# Patient Record
Sex: Female | Born: 1985 | Race: White | Hispanic: No | Marital: Married | Smoking: Former smoker
Health system: Southern US, Community
[De-identification: ages and names within clinical notes are randomized; demographics above are authoritative.]

## PROBLEM LIST (undated history)

## (undated) ENCOUNTER — Inpatient Hospital Stay: Payer: Self-pay

## (undated) DIAGNOSIS — F329 Major depressive disorder, single episode, unspecified: Secondary | ICD-10-CM

## (undated) DIAGNOSIS — F32A Depression, unspecified: Secondary | ICD-10-CM

## (undated) HISTORY — PX: TONSILLECTOMY: SUR1361

---

## 2012-03-27 LAB — HM PAP SMEAR: HM Pap smear: NEGATIVE

## 2015-12-29 ENCOUNTER — Emergency Department
Admission: EM | Admit: 2015-12-29 | Discharge: 2015-12-29 | Disposition: A | Discharge status: Home / Self Care | Payer: Medicaid Other | Attending: Emergency Medicine | Admitting: Emergency Medicine

## 2015-12-29 ENCOUNTER — Encounter: Payer: Self-pay | Admitting: Emergency Medicine

## 2015-12-29 DIAGNOSIS — J069 Acute upper respiratory infection, unspecified: Secondary | ICD-10-CM | POA: Insufficient documentation

## 2015-12-29 DIAGNOSIS — R05 Cough: Secondary | ICD-10-CM | POA: Diagnosis present

## 2015-12-29 DIAGNOSIS — F329 Major depressive disorder, single episode, unspecified: Secondary | ICD-10-CM | POA: Insufficient documentation

## 2015-12-29 DIAGNOSIS — F17200 Nicotine dependence, unspecified, uncomplicated: Secondary | ICD-10-CM | POA: Insufficient documentation

## 2015-12-29 HISTORY — DX: Depression, unspecified: F32.A

## 2015-12-29 HISTORY — DX: Major depressive disorder, single episode, unspecified: F32.9

## 2015-12-29 MED ORDER — IBUPROFEN 800 MG PO TABS: 800.0000 mg | ORAL_TABLET | Freq: Three times a day (TID) | ORAL | Status: DC | PRN

## 2015-12-29 MED ORDER — HYDROCOD POLST-CPM POLST ER 10-8 MG/5ML PO SUER
5.0000 mL | Freq: Once | ORAL | Status: AC
  Administered 2015-12-29: 5 mL via ORAL
  Filled 2015-12-29: qty 5

## 2015-12-29 MED ORDER — HYDROCOD POLST-CPM POLST ER 10-8 MG/5ML PO SUER: 5.0000 mL | Freq: Two times a day (BID) | ORAL | Status: DC

## 2015-12-29 MED ORDER — PSEUDOEPHEDRINE HCL ER 120 MG PO TB12: 120.0000 mg | ORAL_TABLET | Freq: Two times a day (BID) | ORAL | Status: DC | PRN

## 2015-12-29 NOTE — ED Notes (Signed)
Pt to ed with c/o chest pain and cough that started on Sunday. Pt states chest pain only occurs with cough.  Denies fever.  Reports runny nose, and congestion, sore throat and earache.

## 2015-12-29 NOTE — Discharge Instructions (Signed)

## 2015-12-29 NOTE — ED Notes (Signed)
See triage note   States cough and congestion since this weekend  PA in room on arrival

## 2015-12-29 NOTE — ED Notes (Signed)
Discussed discharge instructions, prescriptions, and follow-up care with patient. No questions or concerns at this time. Pt stable at discharge.  

## 2015-12-29 NOTE — ED Provider Notes (Signed)
[facility] Emergency Department Provider Note   ____________________________________________  Time seen: Approximately 3:21 PM  I have reviewed the triage vital signs and the nursing notes.   HISTORY  Chief Complaint Cough and Chest Pain    HPI Natasha Riddle is a 30 y.o. female patient complaining of cough congestion and chest wall pain for 3 days. Patient state pain and chest care is increased coughing episode. Patient denies any fever or chills associated this complaint. Patient does complain of rhinorrhea sore throat and ear pressure pain. Patient rated her pain discomfort as a 5/10. No palliative measures taken for this complaint.   Past Medical History  Diagnosis Date  . Depression     There are no active problems to display for this patient.   History reviewed. No pertinent past surgical history.  Current Outpatient Rx  Name  Route  Sig  Dispense  Refill  . chlorpheniramine-HYDROcodone (TUSSIONEX PENNKINETIC ER) 10-8 MG/5ML SUER   Oral   Take 5 mLs by mouth 2 (two) times daily.   115 mL   0   . ibuprofen (ADVIL,MOTRIN) 800 MG tablet   Oral   Take 1 tablet (800 mg total) by mouth every 8 (eight) hours as needed.   30 tablet   0   . pseudoephedrine (SUDAFED) 120 MG 12 hr tablet   Oral   Take 1 tablet (120 mg total) by mouth 2 (two) times daily as needed for congestion.   20 tablet   2     Allergies Review of patient's allergies indicates no known allergies.  History reviewed. No pertinent family history.  Social History Social History  Substance Use Topics  . Smoking status: Current Every Day Smoker  . Smokeless tobacco: None  . Alcohol Use: No    Review of Systems Constitutional: No fever/chills Eyes: No visual changes. ZOX:WRUEENT:Sore throat, runny nose, and ear pressure. Cardiovascular: Denies chest pain. Respiratory: Denies shortness of breath.Nonproductive cough Gastrointestinal: No abdominal pain.  No nausea, no  vomiting.  No diarrhea.  No constipation. Genitourinary: Negative for dysuria. Musculoskeletal: Negative for back pain. Skin: Negative for rash. Neurological: Negative for headaches, focal weakness or numbness. Psychiatric:Depression   ____________________________________________   PHYSICAL EXAM:  VITAL SIGNS: ED Triage Vitals  Enc Vitals Group     BP 12/29/15 1509 131/85 mmHg     Pulse Rate 12/29/15 1509 98     Resp 12/29/15 1509 20     Temp 12/29/15 1509 98.5 F (36.9 C)     Temp Source 12/29/15 1509 Oral     SpO2 12/29/15 1509 100 %     Weight 12/29/15 1509 180 lb (81.647 kg)     Height 12/29/15 1509 5\' 3"  (1.6 m)     Head Cir --      Peak Flow --      Pain Score 12/29/15 1509 5     Pain Loc --      Pain Edu? --      Excl. in GC? --     Constitutional: Alert and oriented. Well appearing and in no acute distress. Eyes: Conjunctivae are normal. PERRL. EOMI. Head: Atraumatic. Nose: Edematous nasal turbinates clear rhinorrhea Mouth/Throat: Mucous membranes are moist.  Oropharynx non-erythematous. Neck: No stridor. No cervical spine tenderness to palpation. Hematological/Lymphatic/Immunilogical: No cervical lymphadenopathy. Cardiovascular: Normal rate, regular rhythm. Grossly normal heart sounds.  Good peripheral circulation. Respiratory: Normal respiratory effort.  No retractions. Lungs CTAB.Nonproductive cough Gastrointestinal: Soft and nontender. No distention. No abdominal bruits. No CVA  tenderness. Musculoskeletal: No lower extremity tenderness nor edema.  No joint effusions. Neurologic:  Normal speech and language. No gross focal neurologic deficits are appreciated. No gait instability. Skin:  Skin is warm, dry and intact. No rash noted. Psychiatric: Mood and affect are normal. Speech and behavior are normal.  ____________________________________________   LABS (all labs ordered are listed, but only abnormal results are displayed)  Labs Reviewed - No data to  display ____________________________________________  EKG Read by heart station Dr.,  normal sinus rhythm  ____________________________________________  RADIOLOGY   ____________________________________________   PROCEDURES  Procedure(s) performed: None  Critical Care performed: No  ____________________________________________   INITIAL IMPRESSION / ASSESSMENT AND PLAN / ED COURSE  Pertinent labs & imaging results that were available during my care of the patient were reviewed by me and considered in my medical decision making (see chart for details).  Upper respiratory infection and chest wall pain secondary to continue coughing. Patient given discharge care instructions. Patient given prescription for Sudafed and Tussionex. Ad[facility]. ____________________________________________   FINAL CLINICAL IMPRESSION(S) / ED DIAGNOSES  Final diagnoses:  Acute URI      NEW MEDICATIONS STARTED DURING THIS VISIT:  New Prescriptions   CHLORPHENIRAMINE-HYDROCODONE (TUSSIONEX PENNKINETIC ER) 10-8 MG/5ML SUER    Take 5 mLs by mouth 2 (two) times daily.   IBUPROFEN (ADVIL,MOTRIN) 800 MG TABLET    Take 1 tablet (800 mg total) by mouth every 8 (eight) hours as needed.   PSEUDOEPHEDRINE (SUDAFED) 120 MG 12 HR TABLET    Take 1 tablet (120 mg total) by mouth 2 (two) times daily as needed for congestion.     Note:  This document was prepared using Dragon voice recognition software and may include unintentional dictation errors.    Joni Reining, PA-C 12/29/15 1537  Emily Filbert, MD 12/29/15 [phone]

## 2016-01-27 ENCOUNTER — Emergency Department
Admission: EM | Admit: 2016-01-27 | Discharge: 2016-01-27 | Disposition: A | Discharge status: Home / Self Care | Payer: Medicaid Other | Attending: Emergency Medicine | Admitting: Emergency Medicine

## 2016-01-27 ENCOUNTER — Encounter: Payer: Self-pay | Admitting: *Deleted

## 2016-01-27 DIAGNOSIS — L299 Pruritus, unspecified: Secondary | ICD-10-CM | POA: Diagnosis present

## 2016-01-27 DIAGNOSIS — F17200 Nicotine dependence, unspecified, uncomplicated: Secondary | ICD-10-CM | POA: Diagnosis not present

## 2016-01-27 DIAGNOSIS — F329 Major depressive disorder, single episode, unspecified: Secondary | ICD-10-CM | POA: Diagnosis not present

## 2016-01-27 DIAGNOSIS — L508 Other urticaria: Secondary | ICD-10-CM

## 2016-01-27 DIAGNOSIS — L509 Urticaria, unspecified: Secondary | ICD-10-CM | POA: Insufficient documentation

## 2016-01-27 LAB — URINALYSIS COMPLETE WITH MICROSCOPIC (ARMC ONLY)
Bilirubin Urine: NEGATIVE
GLUCOSE, UA: NEGATIVE mg/dL
Ketones, ur: NEGATIVE mg/dL
Nitrite: POSITIVE — AB
PROTEIN: 30 mg/dL — AB
Specific Gravity, Urine: 1.031 — ABNORMAL HIGH (ref 1.005–1.030)
pH: 5 (ref 5.0–8.0)

## 2016-01-27 LAB — POCT PREGNANCY, URINE: PREG TEST UR: NEGATIVE

## 2016-01-27 MED ORDER — DEXAMETHASONE SODIUM PHOSPHATE 10 MG/ML IJ SOLN
10.0000 mg | Freq: Once | INTRAMUSCULAR | Status: AC
  Administered 2016-01-27: 10 mg via INTRAMUSCULAR
  Filled 2016-01-27: qty 1

## 2016-01-27 MED ORDER — RANITIDINE HCL 150 MG PO TABS: 150.0000 mg | ORAL_TABLET | Freq: Two times a day (BID) | ORAL | Status: DC

## 2016-01-27 MED ORDER — PREDNISONE 10 MG PO TABS: ORAL_TABLET | ORAL | Status: DC

## 2016-01-27 MED ORDER — DIPHENHYDRAMINE HCL 25 MG PO CAPS
50.0000 mg | ORAL_CAPSULE | Freq: Once | ORAL | Status: AC
  Administered 2016-01-27: 50 mg via ORAL
  Filled 2016-01-27: qty 2

## 2016-01-27 MED ORDER — FAMOTIDINE IN NACL 20-0.9 MG/50ML-% IV SOLN
20.0000 mg | Freq: Once | INTRAVENOUS | Status: AC
  Administered 2016-01-27: 20 mg via INTRAVENOUS
  Filled 2016-01-27: qty 50

## 2016-01-27 NOTE — Discharge Instructions (Signed)
Hives Hives are itchy, red, puffy (swollen) areas of the skin. Hives can change in size and location on your body. Hives can come and go for hours, days, or weeks. Hives do not spread from person to person (noncontagious). Scratching, exercise, and stress can make your hives worse. HOME CARE  Avoid things that cause your hives (triggers).  Take antihistamine medicines as told by your doctor. Do not drive while taking an antihistamine.  Take any other medicines for itching as told by your doctor.  Wear loose-fitting clothing.  Keep all doctor visits as told. GET HELP RIGHT AWAY IF:   You have a fever.  Your tongue or lips are puffy.  You have trouble breathing or swallowing.  You feel tightness in the throat or chest.  You have belly (abdominal) pain.  You have lasting or severe itching that is not helped by medicine.  You have painful or puffy joints. These problems may be the first sign of a life-threatening allergic reaction. Call your local emergency services (911 in [place] MAKE SURE YOU:   Understand these instructions.  Will watch your condition.  Will get help right away if you are not doing well or get worse.   This information is not intended to replace advice given to you by your health care provider. Make sure you discuss any questions you have with your health care provider.   Document Released: 05/17/2008 Document Revised: 02/07/2012 Document Reviewed: 11/01/2011 Elsevier Interactive Patient Education 2016 [employer]    [facility]. Call for an appointment. Continue taking Benadryl one or 2 every 6 hours as needed for itching. Begin taking Zantac 1 twice a day and prednisone 60 mg taper over the next 6 days as directed. Return to the emergency room if any severe worsening of your symptoms.

## 2016-01-27 NOTE — ED Notes (Signed)
Pt has red itchy rash on bilateral arms and chest

## 2016-01-27 NOTE — ED Provider Notes (Signed)
[facility] Emergency Department Provider Note   ____________________________________________  Time seen: Approximately 12:41 PM  I have reviewed the triage vital signs and the nursing notes.   HISTORY  Chief Complaint Rash   HPI Pamalee Marcoe is a 30 y.o. female is here complaint of extremely itchy rash that began on her bilateral arms and chest last evening. Patient states she is unaware of any new exposures or any history of allergens. Patient denies any difficulty breathing or swallowing. She is continued to eat and drink as normal. Patient has not taken any over-the-counter medication for itching. She denies any previous problems such as this and no one else in the house has similar symptoms. Patient complains that she is actually scratched until her skin has been bleeding. Currently she denies any pain.   Past Medical History  Diagnosis Date  . Depression     There are no active problems to display for this patient.   History reviewed. No pertinent past surgical history.  Current Outpatient Rx  Name  Route  Sig  Dispense  Refill  . predniSONE (DELTASONE) 10 MG tablet      Take 6 tablets  today, on day 2 take 5 tablets, day 3 take 4 tablets, day 4 take 3 tablets, day 5 take  2 tablets and 1 tablet the last day   21 tablet   0   . ranitidine (ZANTAC) 150 MG tablet   Oral   Take 1 tablet (150 mg total) by mouth 2 (two) times daily.   60 tablet   1     Allergies Review of patient's allergies indicates no known allergies.  No family history on file.  Social History Social History  Substance Use Topics  . Smoking status: Current Every Day Smoker  . Smokeless tobacco: None  . Alcohol Use: No    Review of Systems Constitutional: No fever/chills ENT: No sore throat. Cardiovascular: Denies chest pain. Respiratory: Denies shortness of breath. Gastrointestinal:   No nausea, no vomiting.   Musculoskeletal: Negative for muscle or  joint ache. Skin: Positive for itching. Neurological: Negative for headaches, focal weakness or numbness.  10-point ROS otherwise negative.  ____________________________________________   PHYSICAL EXAM:  VITAL SIGNS: ED Triage Vitals  Enc Vitals Group     BP 01/27/16 1224 117/63 mmHg     Pulse Rate 01/27/16 1224 88     Resp 01/27/16 1224 20     Temp 01/27/16 1224 98 F (36.7 C)     Temp Source 01/27/16 1224 Oral     SpO2 01/27/16 1224 98 %     Weight 01/27/16 1224 170 lb (77.111 kg)     Height 01/27/16 1224  (1.6 m)     Head Cir --      Peak Flow --      Pain Score 01/27/16 1238 0     Pain Loc --      Pain Edu? --      Excl. in GC? --     Constitutional: Alert and oriented. Well appearing and in no acute distress. Eyes: Conjunctivae are normal. PERRL. EOMI. Head: Atraumatic. Nose: No congestion/rhinnorhea. Mouth/Throat: Mucous membranes are moist.  Oropharynx non-erythematous.No edema is noted posterior pharynx. Patient is able to swallow saliva without any difficulty and no change in voice was noted. Neck: No stridor.   Cardiovascular: Normal rate, regular rhythm. Grossly normal heart sounds.  Good peripheral circulation. Respiratory: Normal respiratory effort.  No retractions. Lungs CTAB. Gastrointestinal: Soft and  nontender. No distention. Musculoskeletal: Moves upper and lower extremities without any difficulty. Normal gait was noted. Neurologic:  Normal speech and language. No gross focal neurologic deficits are appreciated. No gait instability. Skin:  Skin is warm, dry. On exam there is no actual rash. There is erythematous patches where patient has been scratching vigorously at. There is one area on the lower extremity that is actually superficially open secondary to scratching and has bled prior to her arrival in the emergency room. There is no hives to be noted on the torso, upper or lower extremities. Psychiatric: Mood and affect are normal. Speech and  behavior are normal.  ____________________________________________   LABS (all labs ordered are listed, but only abnormal results are displayed)  Labs Reviewed  URINALYSIS COMPLETEWITH MICROSCOPIC ([facility] ONLY) - Abnormal; Notable for the following:    Color, Urine YELLOW (*)    APPearance CLOUDY (*)    Specific Gravity, Urine 1.031 (*)    Hgb urine dipstick 3+ (*)    Protein, ur 30 (*)    Nitrite POSITIVE (*)    Leukocytes, UA TRACE (*)    Bacteria, UA MANY (*)    Squamous Epithelial / LPF 6-30 (*)    All other components within normal limits  POC URINE PREG, ED  POCT PREGNANCY, URINE     PROCEDURES  Procedure(s) performed: None  Critical Care performed: No  ____________________________________________   INITIAL IMPRESSION / ASSESSMENT AND PLAN / ED COURSE  Pertinent labs & imaging results that were available during my care of the patient were reviewed by me and considered in my medical decision making (see chart for details).  ----------------------------------------- 2:57 PM on 01/27/2016 ----------------------------------------- No continued itching and no rash. Patient of Benadryl by mouth and Decadron 10 mg IM while in the emergency room with some relief but not completely and patient continued to scratch. After the Pepcid patient had absolutely no itching or continued rash. Patient denied any difficulty breathing or speaking. Patient to [facility] up with [facility]. She was discharged with instructions to continue Benadryl one or 2 every 6 hours as needed for itching, prednisone 60 mg 6 day taper, and Zantac 150 mg twice a day.  ____________________________________________   FINAL CLINICAL IMPRESSION(S) / ED DIAGNOSES  Final diagnoses:  Urticaria, acute      NEW MEDICATIONS STARTED DURING THIS VISIT:  New Prescriptions   PREDNISONE (DELTASONE) 10 MG TABLET    Take 6 tablets  today, on day 2 take 5 tablets, day 3 take 4 tablets, day 4 take 3  tablets, day 5 take  2 tablets and 1 tablet the last day   RANITIDINE (ZANTAC) 150 MG TABLET    Take 1 tablet (150 mg total) by mouth 2 (two) times daily.     Note:  This document was prepared using Dragon voice recognition software and may include unintentional dictation errors.    Tommi Rumpshonda L Summers, PA-C 01/27/16 1500  Maurilio LovelyNoelle McLaurin, MD 01/27/16 [phone]

## 2016-06-09 ENCOUNTER — Encounter: Payer: Self-pay | Admitting: Emergency Medicine

## 2016-06-09 ENCOUNTER — Emergency Department
Admission: EM | Admit: 2016-06-09 | Discharge: 2016-06-09 | Disposition: A | Discharge status: Home / Self Care | Payer: Medicaid Other | Attending: Emergency Medicine | Admitting: Emergency Medicine

## 2016-06-09 DIAGNOSIS — R590 Localized enlarged lymph nodes: Secondary | ICD-10-CM | POA: Insufficient documentation

## 2016-06-09 DIAGNOSIS — L989 Disorder of the skin and subcutaneous tissue, unspecified: Secondary | ICD-10-CM | POA: Diagnosis present

## 2016-06-09 DIAGNOSIS — F17200 Nicotine dependence, unspecified, uncomplicated: Secondary | ICD-10-CM | POA: Diagnosis not present

## 2016-06-09 MED ORDER — OXYCODONE-ACETAMINOPHEN 5-325 MG PO TABS
1.0000 | ORAL_TABLET | Freq: Once | ORAL | Status: AC
  Administered 2016-06-09: 1 via ORAL
  Filled 2016-06-09: qty 1

## 2016-06-09 MED ORDER — IBUPROFEN 100 MG/5ML PO SUSP: 600.0000 mg | Freq: Once | ORAL | Status: DC

## 2016-06-09 MED ORDER — IBUPROFEN 600 MG PO TABS: 600.0000 mg | ORAL_TABLET | Freq: Three times a day (TID) | ORAL | 0 refills | Status: DC | PRN

## 2016-06-09 MED ORDER — SULFAMETHOXAZOLE-TRIMETHOPRIM 800-160 MG PO TABS: 1.0000 | ORAL_TABLET | Freq: Two times a day (BID) | ORAL | 0 refills | Status: DC

## 2016-06-09 MED ORDER — SULFAMETHOXAZOLE-TRIMETHOPRIM 800-160 MG PO TABS
1.0000 | ORAL_TABLET | Freq: Once | ORAL | Status: AC
  Administered 2016-06-09: 1 via ORAL
  Filled 2016-06-09: qty 1

## 2016-06-09 MED ORDER — IBUPROFEN 600 MG PO TABS
600.0000 mg | ORAL_TABLET | Freq: Once | ORAL | Status: DC
  Filled 2016-06-09: qty 1

## 2016-06-09 MED ORDER — OXYCODONE-ACETAMINOPHEN 5-325 MG PO TABS: 1.0000 | ORAL_TABLET | Freq: Four times a day (QID) | ORAL | 0 refills | Status: DC | PRN

## 2016-06-09 NOTE — ED Provider Notes (Signed)
[facility] Emergency Department Provider Note   ____________________________________________   None    (approximate)  I have reviewed the triage vital signs and the nursing notes.   HISTORY  Chief Complaint knot in breast    HPI Natasha Riddle is a 30 y.o. female patient complaining of painful nausea lesion left axillary area. Patient states the pain has increased the last 2-3 days. Patient is rating the pain as a 10 over 10. Patient described a pain as sharp. No palliative measures taken for this complaint. Patient denies any drainage from lesion.   Past Medical History:  Diagnosis Date  . Depression     There are no active problems to display for this patient.   History reviewed. No pertinent surgical history.  Prior to Admission medications   Medication Sig Start Date End Date Taking? Authorizing Provider  ibuprofen (ADVIL,MOTRIN) 600 MG tablet Take 1 tablet (600 mg total) by mouth every 8 (eight) hours as needed. 06/09/16   Joni Reining, PA-C  oxyCODONE-acetaminophen (ROXICET) 5-325 MG tablet Take 1 tablet by mouth every 6 (six) hours as needed. 06/09/16 06/09/17  Joni Reining, PA-C  predniSONE (DELTASONE) 10 MG tablet Take 6 tablets  today, on day 2 take 5 tablets, day 3 take 4 tablets, day 4 take 3 tablets, day 5 take  2 tablets and 1 tablet the last day 01/27/16   Tommi Rumps, PA-C  ranitidine (ZANTAC) 150 MG tablet Take 1 tablet (150 mg total) by mouth 2 (two) times daily. 01/27/16 01/26/17  Tommi Rumps, PA-C  sulfamethoxazole-trimethoprim (BACTRIM DS,SEPTRA DS) 800-160 MG tablet Take 1 tablet by mouth 2 (two) times daily. 06/09/16   Joni Reining, PA-C    Allergies Review of patient's allergies indicates no known allergies.  History reviewed. No pertinent family history.  Social History Social History  Substance Use Topics  . Smoking status: Current Every Day Smoker  . Smokeless tobacco: Not on file  . Alcohol use No     Review of Systems Constitutional: No fever/chills Eyes: No visual changes. ENT: No sore throat. Cardiovascular: Denies chest pain. Respiratory: Denies shortness of breath. Gastrointestinal: No abdominal pain.  No nausea, no vomiting.  No diarrhea.  No constipation. Genitourinary: Negative for dysuria. Musculoskeletal: Negative for back pain. Skin: Negative for rash. Swelling left axillary area. Neurological: Negative for headaches, focal weakness or numbness.    ____________________________________________   PHYSICAL EXAM:  VITAL SIGNS: ED Triage Vitals  Enc Vitals Group     BP 06/09/16 1215 113/75     Pulse Rate 06/09/16 1215 (!) 116     Resp 06/09/16 1215 20     Temp 06/09/16 1215 98.1 F (36.7 C)     Temp Source 06/09/16 1215 Oral     SpO2 06/09/16 1215 100 %     Weight 06/09/16 1214 200 lb (90.7 kg)     Height 06/09/16 1214 5\' 3"  (1.6 m)     Head Circumference --      Peak Flow --      Pain Score 06/09/16 1215 10     Pain Loc --      Pain Edu? --      Excl. in GC? --     Constitutional: Alert and oriented. Well appearing and in no acute distress. Eyes: Conjunctivae are normal. PERRL. EOMI. Head: Atraumatic. Nose: No congestion/rhinnorhea. Mouth/Throat: Mucous membranes are moist.  Oropharynx non-erythematous. Neck: No stridor.  No cervical spine tenderness to palpation. Hematological/Lymphatic/Immunilogical: Left axillary  lymphadenopathy. Cardiovascular: Normal rate, regular rhythm. Grossly normal heart sounds.  Good peripheral circulation. Respiratory: Normal respiratory effort.  No retractions. Lungs CTAB. Gastrointestinal: Soft and nontender. No distention. No abdominal bruits. No CVA tenderness. Musculoskeletal: No lower extremity tenderness nor edema.  No joint effusions. Neurologic:  Normal speech and language. No gross focal neurologic deficits are appreciated. No gait instability. Skin:  Skin is warm, dry and intact. No rash noted. Psychiatric:  Mood and affect are normal. Speech and behavior are normal.  ____________________________________________   LABS (all labs ordered are listed, but only abnormal results are displayed)  Labs Reviewed - No data to display ____________________________________________  EKG   ____________________________________________  RADIOLOGY   ____________________________________________   PROCEDURES  Procedure(s) performed: None  Procedures  Critical Care performed: No  ____________________________________________   INITIAL IMPRESSION / ASSESSMENT AND PLAN / ED COURSE  Pertinent labs & imaging results that were available during my care of the patient were reviewed by me and considered in my medical decision making (see chart for details).  Left axillary adenopathy.  Clinical Course     ____________________________________________   FINAL CLINICAL IMPRESSION(S) / ED DIAGNOSES  Final diagnoses:  Axillary adenopathy      NEW MEDICATIONS STARTED DURING THIS VISIT:  New Prescriptions   IBUPROFEN (ADVIL,MOTRIN) 600 MG TABLET    Take 1 tablet (600 mg total) by mouth every 8 (eight) hours as needed.   OXYCODONE-ACETAMINOPHEN (ROXICET) 5-325 MG TABLET    Take 1 tablet by mouth every 6 (six) hours as needed.   SULFAMETHOXAZOLE-TRIMETHOPRIM (BACTRIM DS,SEPTRA DS) 800-160 MG TABLET    Take 1 tablet by mouth 2 (two) times daily.     Note:  This document was prepared using Dragon voice recognition software and may include unintentional dictation errors.    Joni Reiningonald K Jere Bostrom, PA-C 06/09/16 1230    Jene Everyobert Kinner, MD 06/09/16 1230

## 2016-06-09 NOTE — ED Triage Notes (Signed)
Reports knot to left breast/axilla area.

## 2016-06-12 ENCOUNTER — Encounter: Payer: Self-pay | Admitting: Emergency Medicine

## 2016-06-12 ENCOUNTER — Emergency Department
Admission: EM | Admit: 2016-06-12 | Discharge: 2016-06-12 | Disposition: A | Discharge status: Home / Self Care | Payer: Medicaid Other | Attending: Emergency Medicine | Admitting: Emergency Medicine

## 2016-06-12 ENCOUNTER — Emergency Department: Payer: Medicaid Other

## 2016-06-12 DIAGNOSIS — F17200 Nicotine dependence, unspecified, uncomplicated: Secondary | ICD-10-CM | POA: Diagnosis not present

## 2016-06-12 DIAGNOSIS — R222 Localized swelling, mass and lump, trunk: Secondary | ICD-10-CM | POA: Diagnosis not present

## 2016-06-12 DIAGNOSIS — I889 Nonspecific lymphadenitis, unspecified: Secondary | ICD-10-CM | POA: Diagnosis not present

## 2016-06-12 DIAGNOSIS — N644 Mastodynia: Secondary | ICD-10-CM | POA: Diagnosis present

## 2016-06-12 LAB — CBC WITH DIFFERENTIAL/PLATELET
BASOS PCT: 1 %
Basophils Absolute: 0.1 10*3/uL (ref 0–0.1)
EOS PCT: 4 %
Eosinophils Absolute: 0.3 10*3/uL (ref 0–0.7)
HEMATOCRIT: 32.9 % — AB (ref 35.0–47.0)
Hemoglobin: 11.4 g/dL — ABNORMAL LOW (ref 12.0–16.0)
LYMPHS ABS: 1.7 10*3/uL (ref 1.0–3.6)
Lymphocytes Relative: 26 %
MCH: 30.1 pg (ref 26.0–34.0)
MCHC: 34.7 g/dL (ref 32.0–36.0)
MCV: 86.9 fL (ref 80.0–100.0)
MONO ABS: 0.8 10*3/uL (ref 0.2–0.9)
Monocytes Relative: 13 %
Neutro Abs: 3.5 10*3/uL (ref 1.4–6.5)
Neutrophils Relative %: 56 %
Platelets: 169 10*3/uL (ref 150–440)
RBC: 3.79 MIL/uL — ABNORMAL LOW (ref 3.80–5.20)
RDW: 13 % (ref 11.5–14.5)
WBC: 6.4 10*3/uL (ref 3.6–11.0)

## 2016-06-12 LAB — BASIC METABOLIC PANEL
Anion gap: 5 (ref 5–15)
BUN: 10 mg/dL (ref 6–20)
CALCIUM: 8 mg/dL — AB (ref 8.9–10.3)
CO2: 24 mmol/L (ref 22–32)
CREATININE: 0.54 mg/dL (ref 0.44–1.00)
Chloride: 107 mmol/L (ref 101–111)
GFR calc Af Amer: >60 mL/min (ref >60)
GFR calc non Af Amer: >60 mL/min (ref >60)
GLUCOSE: 96 mg/dL (ref 65–99)
Potassium: 3.9 mmol/L (ref 3.5–5.1)
Sodium: 136 mmol/L (ref 135–145)

## 2016-06-12 LAB — POCT PREGNANCY, URINE: Preg Test, Ur: NEGATIVE

## 2016-06-12 MED ORDER — ONDANSETRON HCL 4 MG PO TABS: 4.0000 mg | ORAL_TABLET | Freq: Three times a day (TID) | ORAL | 0 refills | Status: DC | PRN

## 2016-06-12 MED ORDER — CLINDAMYCIN HCL 300 MG PO CAPS: 300.0000 mg | ORAL_CAPSULE | Freq: Three times a day (TID) | ORAL | 0 refills | Status: DC

## 2016-06-12 MED ORDER — ONDANSETRON HCL 4 MG/2ML IJ SOLN
4.0000 mg | Freq: Once | INTRAMUSCULAR | Status: AC
  Administered 2016-06-12: 4 mg via INTRAVENOUS
  Filled 2016-06-12: qty 2

## 2016-06-12 MED ORDER — HYDROMORPHONE HCL 1 MG/ML IJ SOLN
INTRAMUSCULAR | Status: AC
  Administered 2016-06-12: 0.5 mg via INTRAVENOUS
  Filled 2016-06-12: qty 1

## 2016-06-12 MED ORDER — HYDROMORPHONE HCL 1 MG/ML IJ SOLN
0.5000 mg | Freq: Once | INTRAMUSCULAR | Status: AC
  Administered 2016-06-12: 0.5 mg via INTRAVENOUS

## 2016-06-12 MED ORDER — IOPAMIDOL (ISOVUE-300) INJECTION 61%
75.0000 mL | Freq: Once | INTRAVENOUS | Status: AC | PRN
  Administered 2016-06-12: 75 mL via INTRAVENOUS

## 2016-06-12 MED ORDER — IBUPROFEN 800 MG PO TABS: 800.0000 mg | ORAL_TABLET | Freq: Three times a day (TID) | ORAL | 0 refills | Status: DC | PRN

## 2016-06-12 MED ORDER — HYDROCODONE-ACETAMINOPHEN 5-325 MG PO TABS: 1.0000 | ORAL_TABLET | Freq: Four times a day (QID) | ORAL | 0 refills | Status: DC | PRN

## 2016-06-12 MED ORDER — MORPHINE SULFATE (PF) 2 MG/ML IV SOLN
4.0000 mg | Freq: Once | INTRAVENOUS | Status: AC
  Administered 2016-06-12: 4 mg via INTRAVENOUS
  Filled 2016-06-12: qty 2

## 2016-06-12 MED ORDER — SODIUM CHLORIDE 0.9 % IV BOLUS (SEPSIS)
1000.0000 mL | Freq: Once | INTRAVENOUS | Status: AC
  Administered 2016-06-12: 1000 mL via INTRAVENOUS

## 2016-06-12 NOTE — ED Notes (Addendum)
Left axilla tenderness radiating down toward left breast.  Skin intact.  Firm area palpated and pain worse with palpation.  Prescribed Bactrim when seen through ED Thursday.  Patient states she has been taking as prescribed.  Also prescribed ibuprofen and percocet for pain.  Patient states pain has worsened.

## 2016-06-12 NOTE — ED Notes (Signed)
Pt assisted in unhooking from cords in order to use the bathroom. Pt independently ambulated to the bathroom in room with a steady gait and no difficulties reported.

## 2016-06-12 NOTE — ED Provider Notes (Signed)
[facility] Emergency Department Provider Note ____________________________________________   I have reviewed the triage vital signs and the triage nursing note.  HISTORY  Chief Complaint No chief complaint on file.   Historian Patient  HPI Natasha Riddle is a 30 y.o. female who was seen 3 days ago for what she describes as a pain in the left breast which had moved under her left armpit and for which she was told she had a swollen lymph node which might be infected and was given Bactrim. Since then she feels likeit has gotten worse including pain, size, and redness on the skin. No fever. She does have cats, but did not report a scratch.  No trouble breathing or palpitations. No headache. No weakness or numbness. No nausea or vomiting.    Past Medical History:  Diagnosis Date  . Depression     There are no active problems to display for this patient.   History reviewed. No pertinent surgical history.  Prior to Admission medications   Medication Sig Start Date End Date Taking? Authorizing Provider  clindamycin (CLEOCIN) 300 MG capsule Take 1 capsule (300 mg total) by mouth 3 (three) times daily. 06/12/16   Governor Rooks, MD  HYDROcodone-acetaminophen (NORCO/VICODIN) 5-325 MG tablet Take 1 tablet by mouth every 6 (six) hours as needed for moderate pain. 06/12/16   Governor Rooks, MD  ibuprofen (ADVIL,MOTRIN) 800 MG tablet Take 1 tablet (800 mg total) by mouth every 8 (eight) hours as needed. 06/12/16   Governor Rooks, MD  ondansetron (ZOFRAN) 4 MG tablet Take 1 tablet (4 mg total) by mouth every 8 (eight) hours as needed for nausea or vomiting. 06/12/16   Governor Rooks, MD  oxyCODONE-acetaminophen (ROXICET) 5-325 MG tablet Take 1 tablet by mouth every 6 (six) hours as needed. 06/09/16 06/09/17  Joni Reining, PA-C  predniSONE (DELTASONE) 10 MG tablet Take 6 tablets  today, on day 2 take 5 tablets, day 3 take 4 tablets, day 4 take 3 tablets, day 5 take  2 tablets and 1  tablet the last day 01/27/16   Tommi Rumps, PA-C  ranitidine (ZANTAC) 150 MG tablet Take 1 tablet (150 mg total) by mouth 2 (two) times daily. 01/27/16 01/26/17  Tommi Rumps, PA-C    No Known Allergies  History reviewed. No pertinent family history.  Social History Social History  Substance Use Topics  . Smoking status: Current Every Day Smoker  . Smokeless tobacco: Not on file  . Alcohol use No    Review of Systems  Constitutional: Negative for fever. Eyes: Negative for visual changes. ENT: Negative for sore throat. Cardiovascular: Negative for chest pain. Respiratory: Negative for shortness of breath. Gastrointestinal: Negative for abdominal pain, vomiting and diarrhea. Genitourinary: Negative for dysuria. Musculoskeletal: Negative for back pain. Skin: Some skin redness over the left anterolateral chest wall. Neurological: Negative for headache. 10 point Review of Systems otherwise negative ____________________________________________   PHYSICAL EXAM:  VITAL SIGNS: ED Triage Vitals  Enc Vitals Group     BP 06/12/16 0900 111/76     Pulse Rate 06/12/16 0900 (!) 120     Resp 06/12/16 0900 20     Temp 06/12/16 0900 98.3 F (36.8 C)     Temp Source 06/12/16 0900 Oral     SpO2 06/12/16 0900 99 %     Weight --      Height --      Head Circumference --      Peak Flow --  Pain Score 06/12/16 0856 10     Pain Loc --      Pain Edu? --      Excl. in GC? --      Constitutional: Alert and oriented. Well appearing and in no distress.  Tearful in pain. HEENT   Head: Normocephalic and atraumatic.      Eyes: Conjunctivae are normal. PERRL. Normal extraocular movements.      Ears:         Nose: No congestion/rhinnorhea.   Mouth/Throat: Mucous membranes are moist.   Neck: No stridor. Cardiovascular/Chest: Normal rate, regular rhythm.  No murmurs, rubs, or gallops.  Chest the eighth of over the axilla along the anterolateral aspect of the chest, there is  some erythema without induration. No fluctuance. In the lower portion of the axilla. Deep there is a firm tender nodule. Respiratory: Normal respiratory effort without tachypnea nor retractions. Breath sounds are clear and equal bilaterally. No wheezes/rales/rhonchi. Gastrointestinal: Soft. No distention, no guarding, no rebound. Nontender.   Genitourinary/rectal:Deferred Musculoskeletal: Nontender with normal range of motion in all extremities. No joint effusions.  No lower extremity tenderness.  No edema. Neurologic:  Normal speech and language. No gross or focal neurologic deficits are appreciated. Skin:  Skin is warm, dry and intact. No rash noted. Psychiatric: Mood and affect are normal. Speech and behavior are normal. Patient exhibits appropriate insight and judgment.   ____________________________________________  LABS (pertinent positives/negatives)  Labs Reviewed  CBC WITH DIFFERENTIAL/PLATELET - Abnormal; Notable for the following:       Result Value   RBC 3.79 (*)    Hemoglobin 11.4 (*)    HCT 32.9 (*)    All other components within normal limits  BASIC METABOLIC PANEL - Abnormal; Notable for the following:    Calcium 8.0 (*)    All other components within normal limits  BARTONELLA ANITBODY PANEL  POC URINE PREG, ED  POCT PREGNANCY, URINE    ____________________________________________    EKG I, Governor Rooksebecca Laveyah Oriol, MD, the attending physician have personally viewed and interpreted all ECGs.  None ____________________________________________  RADIOLOGY All Xrays were viewed by me. Imaging interpreted by Radiologist.  CT chest with contrast:  IMPRESSION: 1. Nodular lesion with inflammatory changes in adjacent fat in left axilla measures 2.8 cm. Most likely represents acute adenitis. Enlarged lymph nodes in left axilla probable reactive. Follow-up to resolution after appropriate treatment is recommended. No evidence of axillary abscess. 2. Small patchy ground-glass  infiltrate in right upper lobe. Early atypical pneumonia cannot be excluded. Less likely asymmetric pulmonary edema. 3. No mediastinal hematoma or adenopathy. 4. No destructive bony lesions are noted. __________________________________________  PROCEDURES  Procedure(s) performed: None  Critical Care performed: None  ____________________________________________   ED COURSE / ASSESSMENT AND PLAN  Pertinent labs & imaging results that were available during my care of the patient were reviewed by me and considered in my medical decision making (see chart for details).   Ms. Natasha Riddle is here with worsening left axillae pain with a palpable nodule/mass deeply in the area and it seems like it probably is a lymph node, but is fairly large. I did question her about cataract, and she does not report a scratch but she does have cats. She's not had fevers. There is some overlying erythema but it does not look cellulitic necessarily.  She's been on Bactrim for 3 days now, her white blood cell count is normal.  I discussed CT for further evaluation.   Left axilla pain appears to be adenitis.  I am going to switch her from Bactrim causes certainly does not seem to working to clindamycin and continue her on her anti-inflammatories. She states that she had been prescribed Norco but threw them away because they made her nausea. I will write her 5 tablets with Zofran.  CT scan indicated possibility for atypical pneumonia, patient does not have pneumonia symptoms.    CONSULTATIONS:   None   Patient / Family / Caregiver informed of clinical course, medical decision-making process, and agree with plan.   I discussed return precautions, follow-up instructions, and discharge instructions with patient and/or family.   ___________________________________________   FINAL CLINICAL IMPRESSION(S) / ED DIAGNOSES   Final diagnoses:  Adenitis              Note: This dictation was prepared  with Dragon dictation. Any transcriptional errors that result from this process are unintentional    Governor Rooks, MD 06/12/16 [phone]

## 2016-06-12 NOTE — ED Notes (Signed)
Iv pain meds given  Side rails up and effects explained

## 2016-06-12 NOTE — ED Triage Notes (Signed)
Patient arrives to [facility] ED via POV. Patient states she was seen here for an "infected lymphnode" in the left axilla. Patient states she was given Sulfamethoxazole-TMP and the infection has worsened, spreading to the breast with an increase in pain.

## 2016-06-12 NOTE — ED Notes (Signed)
Pt had positive response to pain medication. Pt states the pain is resolved. Palpated pt's axilla with pressure and no pain was reported.

## 2016-06-12 NOTE — ED Notes (Signed)
Pt asleep. Call bell in reach. Bed lowered and locked.

## 2016-06-12 NOTE — ED Notes (Signed)
Dr. Lord at bedside.  

## 2016-06-12 NOTE — Discharge Instructions (Signed)
Return to the emergency department for any fever, worsening pain, numbness or tingling, cough or trouble breathing, or new worsening skin rash.  Stop the Bactrim. Start the clindamycin antibiotic. Continue ibuprofen. For pain take half or one Norco tablet and try adding Zofran.

## 2016-06-12 NOTE — ED Notes (Signed)
See triage note. States she was seen about 3 days ago for same   States area under left axilla is larger and having more pain .  Area is very tender to touch

## 2016-06-12 NOTE — ED Notes (Signed)
Pt requested food. Asking for MD permission. MD requested waiting on CT results. Pt made aware.

## 2016-06-12 NOTE — ED Notes (Signed)
Pt requesting food. MD asked and stated need to read CT first. Pt made aware.

## 2016-06-13 ENCOUNTER — Encounter (HOSPITAL_COMMUNITY): Payer: Self-pay | Admitting: Emergency Medicine

## 2016-06-13 ENCOUNTER — Emergency Department (HOSPITAL_COMMUNITY)
Admission: EM | Admit: 2016-06-13 | Discharge: 2016-06-13 | Disposition: A | Discharge status: Home / Self Care | Payer: Medicaid Other | Attending: Emergency Medicine | Admitting: Emergency Medicine

## 2016-06-13 DIAGNOSIS — L042 Acute lymphadenitis of upper limb: Secondary | ICD-10-CM | POA: Diagnosis not present

## 2016-06-13 DIAGNOSIS — N6332 Unspecified lump in axillary tail of the left breast: Secondary | ICD-10-CM | POA: Diagnosis present

## 2016-06-13 DIAGNOSIS — F17200 Nicotine dependence, unspecified, uncomplicated: Secondary | ICD-10-CM | POA: Diagnosis not present

## 2016-06-13 DIAGNOSIS — I889 Nonspecific lymphadenitis, unspecified: Secondary | ICD-10-CM

## 2016-06-13 MED ORDER — KETOROLAC TROMETHAMINE 60 MG/2ML IM SOLN
30.0000 mg | Freq: Once | INTRAMUSCULAR | Status: AC
  Administered 2016-06-13: 30 mg via INTRAMUSCULAR
  Filled 2016-06-13: qty 2

## 2016-06-13 NOTE — ED Notes (Signed)
Patient is alert and oriented x3.  She was given DC instructions and follow up visit instructions.  Patient gave verbal understanding. She was DC ambulatory under her own power to home.  V/S stable.  He was not showing any signs of distress on DC 

## 2016-06-13 NOTE — ED Triage Notes (Signed)
Patient c/o knot in left axilla area for past couple days.  And been seen at [facility] couple times and put on antibiotics.  Patient denies any drainage or had lump drained.  Patient has large family hx of breast cancer even in men and patient is concerned.  Patient states that areas have increased in size and pain.

## 2016-06-13 NOTE — ED Provider Notes (Signed)
[facility]-EMERGENCY DEPT Provider Note   CSN: [id number] Arrival date & time: 06/13/16  1012     History   Chief Complaint Chief Complaint  Patient presents with  . lump in left axilla    HPI Natasha LemonsBrooke Houdeshell is a 30 y.o. female.  HPI  Severe left axillary pain secondary to Axillary adenitis confirmed by CT at [facility] yesterday, that has been ongoing for several days and gradually worsening. Currently on clindamycin following unsuccessful treatment with Bactrim. CT also noted right upper lobe atypical pneumonia. She has had minimal pain control with over-the-counter medicine and Vicodin. Patient denies any IV drug use, history of HIV, no history of cat scratches or infections of the left arm or breast. There is a strong family history of breast cancer.  Past Medical History:  Diagnosis Date  . Depression     There are no active problems to display for this patient.   History reviewed. No pertinent surgical history.  OB History    No data available       Home Medications    Prior to Admission medications   Medication Sig Start Date End Date Taking? Authorizing Provider  HYDROcodone-acetaminophen (NORCO/VICODIN) 5-325 MG tablet Take 1 tablet by mouth every 6 (six) hours as needed for moderate pain. 06/12/16  Yes Governor Rooksebecca Lord, MD  ibuprofen (ADVIL,MOTRIN) 600 MG tablet Take 600 mg by mouth every 6 (six) hours as needed for moderate pain.   Yes Historical Provider, MD  sulfamethoxazole-trimethoprim (BACTRIM DS,SEPTRA DS) 800-160 MG tablet Take 1 tablet by mouth 2 (two) times daily.   Yes Historical Provider, MD  clindamycin (CLEOCIN) 300 MG capsule Take 1 capsule (300 mg total) by mouth 3 (three) times daily. 06/12/16   Governor Rooksebecca Lord, MD  ibuprofen (ADVIL,MOTRIN) 800 MG tablet Take 1 tablet (800 mg total) by mouth every 8 (eight) hours as needed. 06/12/16   Governor Rooksebecca Lord, MD  ondansetron (ZOFRAN) 4 MG tablet Take 1 tablet (4 mg total) by mouth every 8 (eight) hours as needed for  nausea or vomiting. 06/12/16   Governor Rooksebecca Lord, MD  oxyCODONE-acetaminophen (ROXICET) 5-325 MG tablet Take 1 tablet by mouth every 6 (six) hours as needed. Patient not taking: Reported on 06/13/2016 06/09/16 06/09/17  Joni Reiningonald K Smith, PA-C  predniSONE (DELTASONE) 10 MG tablet Take 6 tablets  today, on day 2 take 5 tablets, day 3 take 4 tablets, day 4 take 3 tablets, day 5 take  2 tablets and 1 tablet the last day Patient not taking: Reported on 06/13/2016 01/27/16   Tommi Rumpshonda L Summers, PA-C  ranitidine (ZANTAC) 150 MG tablet Take 1 tablet (150 mg total) by mouth 2 (two) times daily. Patient not taking: Reported on 06/13/2016 01/27/16 01/26/17  Tommi Rumpshonda L Summers, PA-C    Family History No family history on file.  Social History Social History  Substance Use Topics  . Smoking status: Current Every Day Smoker  . Smokeless tobacco: Never Used  . Alcohol use No     Allergies   Review of patient's allergies indicates no known allergies.   Review of Systems Review of Systems Ten systems are reviewed and are negative for acute change except as noted in the HPI   Physical Exam Updated Vital Signs BP 119/77 (BP Location: Right Arm)   Pulse 100   Temp 98.4 F (36.9 C)   Resp 22   Ht 5\' 3"  (1.6 m)   Wt 200 lb (90.7 kg)   LMP 05/27/2016   SpO2 100%   BMI 35.43 kg/m  Physical Exam  Constitutional: She is oriented to person, place, and time. She appears well-developed and well-nourished. No distress.  HENT:  Head: Normocephalic and atraumatic.  Right Ear: External ear normal.  Left Ear: External ear normal.  Nose: Nose normal.  Eyes: Conjunctivae and EOM are normal. No scleral icterus.  Neck: Normal range of motion and phonation normal.  Cardiovascular: Normal rate and regular rhythm.   Pulmonary/Chest: Effort normal. No stridor. No respiratory distress.  Abdominal: She exhibits no distension.  Musculoskeletal: Normal range of motion. She exhibits no edema.  Lymphadenopathy:       Head  (right side): No submental, no submandibular, no preauricular, no posterior auricular and no occipital adenopathy present.       Head (left side): No submental, no submandibular, no preauricular, no posterior auricular and no occipital adenopathy present.    She has no cervical adenopathy.    She has axillary adenopathy.       Right axillary: No pectoral and no lateral adenopathy present.       Left axillary: Pectoral adenopathy present. No lateral adenopathy present.      Right: No inguinal adenopathy present.       Left: No inguinal adenopathy present.  Neurological: She is alert and oriented to person, place, and time.  Skin: She is not diaphoretic.  Psychiatric: She has a normal mood and affect. Her behavior is normal.  Vitals reviewed.    ED Treatments / Results  Labs (all labs ordered are listed, but only abnormal results are displayed) Labs Reviewed - No data to display  EKG  EKG Interpretation None       Radiology Ct Chest W Contrast  Result Date: 06/12/2016 CLINICAL DATA:  Left axilla swelling pain and redness, infected lymph node EXAM: CT CHEST WITH CONTRAST TECHNIQUE: Multidetector CT imaging of the chest was performed during intravenous contrast administration. CONTRAST:  75mL ISOVUE-300 IOPAMIDOL (ISOVUE-300) INJECTION 61% COMPARISON:  None. FINDINGS: Cardiovascular: Heart size within normal limits. No pericardial effusion. Central pulmonary artery and thoracic aorta is unremarkable. No pericardial effusion. Mediastinum/Nodes: No mediastinal hematoma or adenopathy. No hilar adenopathy. Central airway is patent. Lungs/Pleura: Images of the lung parenchyma shows patchy ground-glass infiltrate in right upper lobe. Atypical pneumonia cannot be excluded. Less likely pulmonary edema. Upper Abdomen: Visualized upper abdomen is unremarkable. No adrenal gland mass is noted. Musculoskeletal: No destructive bony lesions are noted. Sagittal images of the spine shows normal alignment.  Sagittal view of the sternum is unremarkable. Consistent with history there is a enlarged nodular lesion in left axilla with surrounding stranding and inflammatory changes. Measures 2.8 cm. This most likely represent acute adenitis. Additional enlarged lymph nodes are noted in left axilla the largest measures 1.4 cm short-axis probable reactive. Follow-up to resolution after appropriate treatment is recommended to exclude resolution or lymphoproliferative disease. IMPRESSION: 1. Nodular lesion with inflammatory changes in adjacent fat in left axilla measures 2.8 cm. Most likely represents acute adenitis. Enlarged lymph nodes in left axilla probable reactive. Follow-up to resolution after appropriate treatment is recommended. No evidence of axillary abscess. 2. Small patchy ground-glass infiltrate in right upper lobe. Early atypical pneumonia cannot be excluded. Less likely asymmetric pulmonary edema. 3. No mediastinal hematoma or adenopathy. 4. No destructive bony lesions are noted. Electronically Signed   By: Natasha Mead M.D.   On: 06/12/2016 14:52    Procedures Procedures (including critical care time)  Medications Ordered in ED Medications  ketorolac (TORADOL) injection 30 mg (not administered)     Initial  Impression / Assessment and Plan / ED Course  I have reviewed the triage vital signs and the nursing notes.  Pertinent labs & imaging results that were available during my care of the patient were reviewed by me and considered in my medical decision making (see chart for details).  Clinical Course    No masses noted on bilateral breast exam. No other lymphadenopathy noted on exam.   Given IM Toradol for pain control. Patient already has prescription for Vicodin. She is establishing care with a primary care provider but her appointment is in November. Instructed to contact the office to see if there is any openings in the next several days for further workup and management.  The patient is  safe for discharge with strict return precautions.  Final Clinical Impressions(s) / ED Diagnoses   Final diagnoses:  Axillary adenitis   Disposition: Discharge  Condition: Good  I have discussed the results, Dx and Tx plan with the patient who expressed understanding and agree(s) with the plan. Discharge instructions discussed at great length. The patient was given strict return precautions who verbalized understanding of the instructions. No further questions at time of discharge.    Discharge Medication List as of 06/13/2016 11:46 AM      Follow Up: [facility] [address] [place] [address] [phone]  Call  For close follow up to assess for cause and further pain management      Nira Conn, MD 06/13/16 1156

## 2016-06-14 LAB — BARTONELLA ANTIBODY PANEL
B Quintana IgM: NEGATIVE titer
B henselae IgG: NEGATIVE titer
B henselae IgM: NEGATIVE titer
B quintana IgG: NEGATIVE titer

## 2016-08-22 NOTE — L&D Delivery Note (Signed)
Delivery Note At 8:38 AM a viable female was delivered via Vaginal, Spontaneous Delivery (Presentation: LOA).  APGAR: 7, 8; weight 9 lb 3.8 oz (4190 g).   Placenta status: spontaneous, intact  Cord: 3VC and nuchal x 1  with the following complications: .  Cord pH: N/A  Anesthesia:  None Episiotomy: None Lacerations: Labial Suture Repair: none Est. Blood Loss (mL):  300mL  Mom to postpartum.  Baby to Couplet care / Skin to Skin.  Vena Austriandreas Brenlynn Fake 06/08/2017, 9:05 AM

## 2016-10-13 ENCOUNTER — Emergency Department
Admission: EM | Admit: 2016-10-13 | Discharge: 2016-10-13 | Disposition: A | Discharge status: Home / Self Care | Payer: Medicaid Other | Attending: Emergency Medicine | Admitting: Emergency Medicine

## 2016-10-13 ENCOUNTER — Emergency Department: Payer: Medicaid Other

## 2016-10-13 DIAGNOSIS — F17200 Nicotine dependence, unspecified, uncomplicated: Secondary | ICD-10-CM | POA: Insufficient documentation

## 2016-10-13 DIAGNOSIS — O200 Threatened abortion: Secondary | ICD-10-CM

## 2016-10-13 DIAGNOSIS — N939 Abnormal uterine and vaginal bleeding, unspecified: Secondary | ICD-10-CM

## 2016-10-13 DIAGNOSIS — R109 Unspecified abdominal pain: Secondary | ICD-10-CM | POA: Diagnosis not present

## 2016-10-13 DIAGNOSIS — Z3A08 8 weeks gestation of pregnancy: Secondary | ICD-10-CM | POA: Diagnosis not present

## 2016-10-13 DIAGNOSIS — O99331 Smoking (tobacco) complicating pregnancy, first trimester: Secondary | ICD-10-CM | POA: Diagnosis not present

## 2016-10-13 DIAGNOSIS — O209 Hemorrhage in early pregnancy, unspecified: Secondary | ICD-10-CM | POA: Diagnosis present

## 2016-10-13 LAB — URINALYSIS, COMPLETE (UACMP) WITH MICROSCOPIC
Bilirubin Urine: NEGATIVE
Glucose, UA: NEGATIVE mg/dL
Ketones, ur: NEGATIVE mg/dL
Leukocytes, UA: NEGATIVE
Nitrite: NEGATIVE
PH: 8 (ref 5.0–8.0)
Protein, ur: NEGATIVE mg/dL
RBC / HPF: NONE SEEN RBC/hpf (ref 0–5)
SPECIFIC GRAVITY, URINE: 1.004 — AB (ref 1.005–1.030)

## 2016-10-13 LAB — CBC WITH DIFFERENTIAL/PLATELET
BASOS PCT: 1 %
Basophils Absolute: 0.1 10*3/uL (ref 0–0.1)
EOS ABS: 0.3 10*3/uL (ref 0–0.7)
EOS PCT: 3 %
HCT: 34.8 % — ABNORMAL LOW (ref 35.0–47.0)
HEMOGLOBIN: 11.9 g/dL — AB (ref 12.0–16.0)
LYMPHS ABS: 2.4 10*3/uL (ref 1.0–3.6)
Lymphocytes Relative: 20 %
MCH: 29.5 pg (ref 26.0–34.0)
MCHC: 34.3 g/dL (ref 32.0–36.0)
MCV: 85.9 fL (ref 80.0–100.0)
MONO ABS: 1 10*3/uL — AB (ref 0.2–0.9)
MONOS PCT: 8 %
Neutro Abs: 8.4 10*3/uL — ABNORMAL HIGH (ref 1.4–6.5)
Neutrophils Relative %: 68 %
PLATELETS: 272 10*3/uL (ref 150–440)
RBC: 4.05 MIL/uL (ref 3.80–5.20)
RDW: 14.8 % — AB (ref 11.5–14.5)
WBC: 12.2 10*3/uL — ABNORMAL HIGH (ref 3.6–11.0)

## 2016-10-13 LAB — ANTIBODY SCREEN: Antibody Screen: NEGATIVE

## 2016-10-13 LAB — BASIC METABOLIC PANEL
ANION GAP: 5 (ref 5–15)
BUN: 5 mg/dL — ABNORMAL LOW (ref 6–20)
CHLORIDE: 107 mmol/L (ref 101–111)
CO2: 25 mmol/L (ref 22–32)
Calcium: 8.2 mg/dL — ABNORMAL LOW (ref 8.9–10.3)
Creatinine, Ser: 0.43 mg/dL — ABNORMAL LOW (ref 0.44–1.00)
GFR calc Af Amer: >60 mL/min (ref >60)
GLUCOSE: 101 mg/dL — AB (ref 65–99)
POTASSIUM: 3.4 mmol/L — AB (ref 3.5–5.1)
SODIUM: 137 mmol/L (ref 135–145)

## 2016-10-13 LAB — ABO/RH: ABO/RH(D): A NEG

## 2016-10-13 LAB — HCG, QUANTITATIVE, PREGNANCY: HCG, BETA CHAIN, QUANT, S: 44181 m[IU]/mL — AB (ref <5)

## 2016-10-13 MED ORDER — RHO D IMMUNE GLOBULIN 1500 UNIT/2ML IJ SOSY
300.0000 ug | PREFILLED_SYRINGE | Freq: Once | INTRAMUSCULAR | Status: AC
  Administered 2016-10-13: 300 ug via INTRAMUSCULAR
  Filled 2016-10-13: qty 2

## 2016-10-13 MED ORDER — PRENATAL VITAMINS 0.8 MG PO TABS: 1.0000 | ORAL_TABLET | Freq: Every day | ORAL | 3 refills | Status: DC

## 2016-10-13 MED ORDER — OXYCODONE-ACETAMINOPHEN 5-325 MG PO TABS
1.0000 | ORAL_TABLET | Freq: Once | ORAL | Status: AC
Start: 2016-10-13 — End: 2016-10-13
  Administered 2016-10-13: 1 via ORAL
  Filled 2016-10-13: qty 1

## 2016-10-13 NOTE — ED Notes (Signed)
edp aware that pt unable to obtain urine.

## 2016-10-13 NOTE — ED Provider Notes (Signed)
[facility] Emergency Department Provider Note  ____________________________________________  Time seen: Approximately 1:04 PM  I have reviewed the triage vital signs and the nursing notes.   HISTORY  Chief Complaint Vaginal Bleeding    HPI Natasha Riddle is a 31 y.o. female who complains of vaginal bleedingand cramping abdominal pain. Denies fever chills chest pain shortness of breath. Reports she is about [redacted] weeks pregnant. Has 2 prior miscarriages. No vaginal discharge. No urinary symptoms.     Past Medical History:  Diagnosis Date  . Depression      There are no active problems to display for this patient.    History reviewed. No pertinent surgical history.   Prior to Admission medications   Medication Sig Start Date End Date Taking? Authorizing Provider  HYDROcodone-acetaminophen (NORCO/VICODIN) 5-325 MG tablet Take 1 tablet by mouth every 6 (six) hours as needed for moderate pain. Patient not taking: Reported on 10/13/2016 06/12/16   Governor Rooks, MD  ibuprofen (ADVIL,MOTRIN) 600 MG tablet Take 600 mg by mouth every 6 (six) hours as needed for moderate pain.    Historical Provider, MD  ibuprofen (ADVIL,MOTRIN) 800 MG tablet Take 1 tablet (800 mg total) by mouth every 8 (eight) hours as needed. Patient not taking: Reported on 10/13/2016 06/12/16   Governor Rooks, MD  ondansetron (ZOFRAN) 4 MG tablet Take 1 tablet (4 mg total) by mouth every 8 (eight) hours as needed for nausea or vomiting. Patient not taking: Reported on 10/13/2016 06/12/16   Governor Rooks, MD  oxyCODONE-acetaminophen (ROXICET) 5-325 MG tablet Take 1 tablet by mouth every 6 (six) hours as needed. Patient not taking: Reported on 06/13/2016 06/09/16 06/09/17  Joni Reining, PA-C  predniSONE (DELTASONE) 10 MG tablet Take 6 tablets  today, on day 2 take 5 tablets, day 3 take 4 tablets, day 4 take 3 tablets, day 5 take  2 tablets and 1 tablet the last day Patient not taking: Reported on  06/13/2016 01/27/16   Tommi Rumps, PA-C  Prenatal Multivit-Min-Fe-FA (PRENATAL VITAMINS) 0.8 MG tablet Take 1 tablet by mouth daily. 10/13/16   Sharman Cheek, MD  ranitidine (ZANTAC) 150 MG tablet Take 1 tablet (150 mg total) by mouth 2 (two) times daily. Patient not taking: Reported on 06/13/2016 01/27/16 01/26/17  Tommi Rumps, PA-C     Allergies Patient has no known allergies.   No family history on file.  Social History Social History  Substance Use Topics  . Smoking status: Current Every Day Smoker  . Smokeless tobacco: Never Used  . Alcohol use No    Review of Systems  Constitutional:   No fever or chills.  ENT:   No sore throat. No rhinorrhea. Cardiovascular:   No chest pain. Respiratory:   No dyspnea or cough. Gastrointestinal:   Negative for abdominal pain, vomiting and diarrhea.  Genitourinary:   Negative for dysuria or difficulty urinating. Musculoskeletal:   Negative for focal pain or swelling Neurological:   Negative for headaches 10-point ROS otherwise negative.  ____________________________________________   PHYSICAL EXAM:  VITAL SIGNS: ED Triage Vitals  Enc Vitals Group     BP 10/13/16 0859 (!) 143/89     Pulse Rate 10/13/16 0859 (!) 119     Resp 10/13/16 0859 18     Temp 10/13/16 0859 98 F (36.7 C)     Temp Source 10/13/16 0859 Oral     SpO2 10/13/16 0859 100 %     Weight 10/13/16 0855 215 lb (97.5 kg)  Height 10/13/16 0855 5\' 3"  (1.6 m)     Head Circumference --      Peak Flow --      Pain Score 10/13/16 0858 8     Pain Loc --      Pain Edu? --      Excl. in GC? --     Vital signs reviewed, nursing assessments reviewed.   Constitutional:   Alert and oriented. Well appearing and in no distress. Eyes:   No scleral icterus. No conjunctival pallor. PERRL. EOMI.  No nystagmus. ENT   Head:   Normocephalic and atraumatic.   Nose:   No congestion/rhinnorhea. No septal hematoma   Mouth/Throat:   MMM, no pharyngeal erythema.  No peritonsillar mass.    Neck:   No stridor. No SubQ emphysema. No meningismus. Hematological/Lymphatic/Immunilogical:   No cervical lymphadenopathy. Cardiovascular:   RRR. Symmetric bilateral radial and DP pulses.  No murmurs.  Respiratory:   Normal respiratory effort without tachypnea nor retractions. Breath sounds are clear and equal bilaterally. No wheezes/rales/rhonchi. Gastrointestinal:   Soft and nontender. Non distended. There is no CVA tenderness.  No rebound, rigidity, or guarding. Genitourinary:   deferred Musculoskeletal:   Normal range of motion in all extremities. No joint effusions.  No lower extremity tenderness.  No edema. Neurologic:   Normal speech and language.  CN 2-10 normal. Motor grossly intact. No gross focal neurologic deficits are appreciated.  Skin:    Skin is warm, dry and intact. No rash noted.  No petechiae, purpura, or bullae.  ____________________________________________    LABS (pertinent positives/negatives) (all labs ordered are listed, but only abnormal results are displayed) Labs Reviewed  CBC WITH DIFFERENTIAL/PLATELET - Abnormal; Notable for the following:       Result Value   WBC 12.2 (*)    Hemoglobin 11.9 (*)    HCT 34.8 (*)    RDW 14.8 (*)    Neutro Abs 8.4 (*)    Monocytes Absolute 1.0 (*)    All other components within normal limits  URINALYSIS, COMPLETE (UACMP) WITH MICROSCOPIC - Abnormal; Notable for the following:    Color, Urine COLORLESS (*)    APPearance CLEAR (*)    Specific Gravity, Urine 1.004 (*)    Hgb urine dipstick SMALL (*)    Bacteria, UA RARE (*)    Squamous Epithelial / LPF 0-5 (*)    All other components within normal limits  HCG, QUANTITATIVE, PREGNANCY - Abnormal; Notable for the following:    hCG, Beta Chain, Quant, S 44,181 (*)    All other components within normal limits  BASIC METABOLIC PANEL - Abnormal; Notable for the following:    Potassium 3.4 (*)    Glucose, Bld 101 (*)    BUN 5 (*)     Creatinine, Ser 0.43 (*)    Calcium 8.2 (*)    All other components within normal limits  URINE CULTURE  ABO/RH  ANTIBODY SCREEN  RHOGAM INJECTION   ____________________________________________   EKG    ____________________________________________    RADIOLOGY  [place] Ob Comp < 14 Wks  Result Date: 10/13/2016 CLINICAL DATA:  Vaginal bleeding this morning. First-trimester pregnancy. EXAM: OBSTETRIC <14 WK US AND TRANSVAGINAL OB US TECHNIQUE: Both transabdominal and transvaginal ultrasound examinations were performed for complete evaluation of the gestation as well as the maternal uterus, adnexal regions, and pelvic cul-de-sac. Transvaginal technique was performed to assess early pregnancy. COMPARISON:  None. FINDINGS: Intrauterine gestational sac: Single Yolk sac:  Visualized. Embryo:  Visualized. Cardiac Activity:  Visualized. Heart Rate: 118  bpm CRL:  5.4  mm   6 w   2 d                  [place] EDC: 06/06/2017 Subchorionic hemorrhage: 11 x 5 x 4 mm subchorionic hemorrhage along the left aspect of the sac Maternal uterus/adnexae: Corpus luteum on the right. IMPRESSION: 1. Single living intrauterine gestation measuring 6 weeks 2 days. 2. 11 x 5 x 4 mm subchorionic hemorrhage. Electronically Signed   By: Marnee Spring M.D.   On: 10/13/2016 12:08   US Ob Transvaginal  Result Date: 10/13/2016 CLINICAL DATA:  Vaginal bleeding this morning. First-trimester pregnancy. EXAM: OBSTETRIC <14 WK [place] AND TRANSVAGINAL OB US TECHNIQUE: Both transabdominal and transvaginal ultrasound examinations were performed for complete evaluation of the gestation as well as the maternal uterus, adnexal regions, and pelvic cul-de-sac. Transvaginal technique was performed to assess early pregnancy. COMPARISON:  None. FINDINGS: Intrauterine gestational sac: Single Yolk sac:  Visualized. Embryo:  Visualized. Cardiac Activity: Visualized. Heart Rate: 118  bpm CRL:  5.4  mm   6 w   2 d                  [place] EDC: 06/06/2017  Subchorionic hemorrhage: 11 x 5 x 4 mm subchorionic hemorrhage along the left aspect of the sac Maternal uterus/adnexae: Corpus luteum on the right. IMPRESSION: 1. Single living intrauterine gestation measuring 6 weeks 2 days. 2. 11 x 5 x 4 mm subchorionic hemorrhage. Electronically Signed   By: Marnee Spring M.D.   On: 10/13/2016 12:08    ____________________________________________   PROCEDURES Procedures  ____________________________________________   INITIAL IMPRESSION / ASSESSMENT AND PLAN / ED COURSE  Pertinent labs & imaging results that were available during my care of the patient were reviewed by me and considered in my medical decision making (see chart for details).  Patient well appearing no acute distress. Not tachycardic on my exam despite triage assessment. Abdomen is benign on exam. Rh-, rug and given. Ultrasound shows live IUP, with subchorionic hemorrhage. Patient counseled extensively on threatened miscarriage and inability to predict what happened next. Recommended follow-up with OB given higher risk associated with her vaginal bleeding and subchronic hemorrhage and prior miscarriages.     Clinical Course as of Oct 13 1302  Thu Oct 13, 2016  1024 Rh neg. Requested rhogam from blood bank  [PS]    Clinical Course User Index [PS] Sharman Cheek, MD     ____________________________________________   FINAL CLINICAL IMPRESSION(S) / ED DIAGNOSES  Final diagnoses:  Vaginal bleeding  Threatened miscarriage      New Prescriptions   PRENATAL MULTIVIT-MIN-FE-FA (PRENATAL VITAMINS) 0.8 MG TABLET    Take 1 tablet by mouth daily.     Portions of this note were generated with dragon dictation software. Dictation errors may occur despite best attempts at proofreading.    Sharman Cheek, MD 10/13/16 [phone]

## 2016-10-13 NOTE — ED Triage Notes (Signed)
Pt c/o lower abd pain with vaginal bleeding that started this morning. States she is approximately [redacted] weeks pregnant.

## 2016-10-13 NOTE — Discharge Instructions (Signed)
Ultrasound shows: 1. Single living intrauterine gestation measuring 6 weeks 2 days. 2. 11 x 5 x 4 mm subchorionic hemorrhage.

## 2016-10-13 NOTE — ED Notes (Signed)
edp back in to reeval 

## 2016-10-13 NOTE — ED Notes (Signed)
Patient transported to Ultrasound 

## 2016-10-14 LAB — RHOGAM INJECTION: Unit division: 0

## 2016-10-14 NOTE — ED Notes (Signed)
Call from lab , reported positive group B strep, reported to Dr Scotty CourtStafford,

## 2016-10-14 NOTE — ED Notes (Signed)
Called phone number listed , phone is turned off

## 2016-10-14 NOTE — ED Notes (Signed)
Called emergency contact, Randall no answer

## 2016-10-14 NOTE — ED Notes (Signed)
Dr.Stafford notified ; unable to reach pt or emergency contact

## 2016-10-15 LAB — URINE CULTURE: Culture: 60000 — AB

## 2016-10-16 DIAGNOSIS — Z3A01 Less than 8 weeks gestation of pregnancy: Secondary | ICD-10-CM | POA: Diagnosis not present

## 2016-10-16 DIAGNOSIS — O99331 Smoking (tobacco) complicating pregnancy, first trimester: Secondary | ICD-10-CM | POA: Insufficient documentation

## 2016-10-16 DIAGNOSIS — F17200 Nicotine dependence, unspecified, uncomplicated: Secondary | ICD-10-CM | POA: Insufficient documentation

## 2016-10-16 DIAGNOSIS — O200 Threatened abortion: Secondary | ICD-10-CM | POA: Insufficient documentation

## 2016-10-16 DIAGNOSIS — O209 Hemorrhage in early pregnancy, unspecified: Secondary | ICD-10-CM | POA: Diagnosis present

## 2016-10-16 NOTE — ED Triage Notes (Signed)
Pt states that she is almost [redacted] weeks pregnant, states that she woke up with increased vaginal bleeding, states that she doesn't feel well

## 2016-10-17 ENCOUNTER — Emergency Department: Payer: Medicaid Other

## 2016-10-17 ENCOUNTER — Emergency Department
Admission: EM | Admit: 2016-10-17 | Discharge: 2016-10-17 | Disposition: A | Discharge status: Home / Self Care | Payer: Medicaid Other | Attending: Emergency Medicine | Admitting: Emergency Medicine

## 2016-10-17 DIAGNOSIS — O200 Threatened abortion: Secondary | ICD-10-CM

## 2016-10-17 LAB — CBC
HCT: 36.1 % (ref 35.0–47.0)
Hemoglobin: 12.3 g/dL (ref 12.0–16.0)
MCH: 29.4 pg (ref 26.0–34.0)
MCHC: 34 g/dL (ref 32.0–36.0)
MCV: 86.5 fL (ref 80.0–100.0)
PLATELETS: 317 10*3/uL (ref 150–440)
RBC: 4.17 MIL/uL (ref 3.80–5.20)
RDW: 15.1 % — AB (ref 11.5–14.5)
WBC: 14.6 10*3/uL — ABNORMAL HIGH (ref 3.6–11.0)

## 2016-10-17 LAB — BASIC METABOLIC PANEL
Anion gap: 8 (ref 5–15)
BUN: 7 mg/dL (ref 6–20)
CALCIUM: 8.9 mg/dL (ref 8.9–10.3)
CO2: 23 mmol/L (ref 22–32)
CREATININE: 0.5 mg/dL (ref 0.44–1.00)
Chloride: 104 mmol/L (ref 101–111)
GFR calc non Af Amer: >60 mL/min (ref >60)
Glucose, Bld: 87 mg/dL (ref 65–99)
Potassium: 3.8 mmol/L (ref 3.5–5.1)
SODIUM: 135 mmol/L (ref 135–145)

## 2016-10-17 LAB — HCG, QUANTITATIVE, PREGNANCY: HCG, BETA CHAIN, QUANT, S: 66948 m[IU]/mL — AB (ref <5)

## 2016-10-17 MED ORDER — ACETAMINOPHEN 500 MG PO TABS
1000.0000 mg | ORAL_TABLET | Freq: Once | ORAL | Status: AC
  Administered 2016-10-17: 1000 mg via ORAL

## 2016-10-17 MED ORDER — ACETAMINOPHEN 500 MG PO TABS
ORAL_TABLET | ORAL | Status: AC
  Filled 2016-10-17: qty 2

## 2016-10-17 MED ORDER — SODIUM CHLORIDE 0.9 % IV BOLUS (SEPSIS)
1000.0000 mL | Freq: Once | INTRAVENOUS | Status: AC
  Administered 2016-10-17: 1000 mL via INTRAVENOUS

## 2016-10-17 NOTE — ED Notes (Signed)
Patient transported to US 

## 2016-10-17 NOTE — ED Notes (Signed)
Dr. Paduchowski at pt's bedside. 

## 2016-10-17 NOTE — ED Notes (Signed)
Dr. Lenard LancePaduchowski notified that pt is sweating and crying stating that she is in pain.

## 2016-10-17 NOTE — ED Provider Notes (Signed)
[facility] Emergency Department Provider Note  Time seen: 12:23 AM  I have reviewed the triage vital signs and the nursing notes.   HISTORY  Chief Complaint Vaginal Bleeding    HPI Natasha Riddle is a 31 y.o. female with a past medical history of depression, G5 P2 A2 approximately [redacted] weeks pregnant who presents to the emergency department with increased vaginal bleeding and lower abdominal cramping. According to the patient and record review she was seen in the emergency department 10/13/16 for vaginal bleeding. Was told she had a subchorionic hemorrhage. She states she has continued to have very light spotting ever since however his evening she developed moderate vaginal bleeding with increased lower abdominal cramping which concerned her so she came to the emergency department. Patient is tearful in the emergency department concerned that she is having a miscarriage. States mild lower abdominal cramping.  Past Medical History:  Diagnosis Date  . Depression     There are no active problems to display for this patient.   No past surgical history on file.  Prior to Admission medications   Medication Sig Start Date End Date Taking? Authorizing Provider  HYDROcodone-acetaminophen (NORCO/VICODIN) 5-325 MG tablet Take 1 tablet by mouth every 6 (six) hours as needed for moderate pain. Patient not taking: Reported on 10/13/2016 06/12/16   Governor Rooksebecca Lord, MD  ibuprofen (ADVIL,MOTRIN) 600 MG tablet Take 600 mg by mouth every 6 (six) hours as needed for moderate pain.    Historical Provider, MD  ibuprofen (ADVIL,MOTRIN) 800 MG tablet Take 1 tablet (800 mg total) by mouth every 8 (eight) hours as needed. Patient not taking: Reported on 10/13/2016 06/12/16   Governor Rooksebecca Lord, MD  ondansetron (ZOFRAN) 4 MG tablet Take 1 tablet (4 mg total) by mouth every 8 (eight) hours as needed for nausea or vomiting. Patient not taking: Reported on 10/13/2016 06/12/16   Governor Rooksebecca Lord, MD   oxyCODONE-acetaminophen (ROXICET) 5-325 MG tablet Take 1 tablet by mouth every 6 (six) hours as needed. Patient not taking: Reported on 06/13/2016 06/09/16 06/09/17  Joni Reiningonald K Smith, PA-C  predniSONE (DELTASONE) 10 MG tablet Take 6 tablets  today, on day 2 take 5 tablets, day 3 take 4 tablets, day 4 take 3 tablets, day 5 take  2 tablets and 1 tablet the last day Patient not taking: Reported on 06/13/2016 01/27/16   Tommi Rumpshonda L Summers, PA-C  Prenatal Multivit-Min-Fe-FA (PRENATAL VITAMINS) 0.8 MG tablet Take 1 tablet by mouth daily. 10/13/16   Sharman CheekPhillip Stafford, MD  ranitidine (ZANTAC) 150 MG tablet Take 1 tablet (150 mg total) by mouth 2 (two) times daily. Patient not taking: Reported on 06/13/2016 01/27/16 01/26/17  Tommi Rumpshonda L Summers, PA-C    No Known Allergies  No family history on file.  Social History Social History  Substance Use Topics  . Smoking status: Current Every Day Smoker  . Smokeless tobacco: Never Used  . Alcohol use No    Review of Systems Constitutional: Negative for fever. Cardiovascular: Negative for chest pain. Respiratory: Negative for shortness of breath. Gastrointestinal: Lower abdominal cramping. Genitourinary: Increased vaginal bleeding. Musculoskeletal: Mild lower back pain. 10-point ROS otherwise negative.  ____________________________________________   PHYSICAL EXAM:  VITAL SIGNS: ED Triage Vitals  Enc Vitals Group     BP 10/16/16 2359 126/75     Pulse Rate 10/16/16 2359 (!) 136     Resp 10/16/16 2359 20     Temp 10/16/16 2359 99.1 F (37.3 C)     Temp Source 10/16/16 2359 Oral  SpO2 10/16/16 2359 96 %     Weight 10/17/16 0000 215 lb (97.5 kg)     Height 10/17/16 0000 5\' 3"  (1.6 m)     Head Circumference --      Peak Flow --      Pain Score 10/17/16 0000 7     Pain Loc --      Pain Edu? --      Excl. in GC? --     Constitutional: Alert and oriented. Well appearing and in no distress. Eyes: Normal exam ENT   Head: Normocephalic and  atraumatic.   Mouth/Throat: Mucous membranes are moist. Cardiovascular: Normal rate, regular rhythm. No murmur Respiratory: Normal respiratory effort without tachypnea nor retractions. Breath sounds are clear Gastrointestinal: Soft and nontender. No distention. Musculoskeletal: Nontender with normal range of motion in all extremities Neurologic:  Normal speech and language. No gross focal neurologic deficits  Skin:  Skin is warm, dry and intact.  Psychiatric: Mood and affect are normal.  ____________________________________________   RADIOLOGY  Ultrasound shows intrauterine pregnancy at 7 weeks 1 day. Heart rate of 140 bpm. No subchorionic hemorrhage visualized.  ____________________________________________   INITIAL IMPRESSION / ASSESSMENT AND PLAN / ED COURSE  Pertinent labs & imaging results that were available during my care of the patient were reviewed by me and considered in my medical decision making (see chart for details).  The patient presents to the emergency department with vaginal bleeding. Patient's proximal to [redacted] weeks pregnant, recently seen for subchorionic hemorrhage 3 days ago but states the bleeding has increased tonight along with increased cramping. We will treat with Tylenol check labs including beta hCG quantitative, obtain an ultrasound and monitor closely.  Ultrasound is reassuring showing a 7 week 1 day IUP with a heart rate of 140 bpm. No subchorionic hemorrhage. Beta hCG has increased. We will have the patient follow-up with OB/GYN. ____________________________________________   FINAL CLINICAL IMPRESSION(S) / ED DIAGNOSES  Threatened miscarriage    Minna Antis, MD 10/17/16 [phone]

## 2016-10-17 NOTE — ED Notes (Signed)
Pt was recently dx with subchorionic hemorrhage on 10/13/2016 . Pt stating that she has had some light spotting since then but this evening she woke up and her underwear was saturated in blood. Pt has underwear with her and nurse notice blood on underwear. Pt stating that she has also had an increase in abdominal cramping. Pt appears anxious at this time and nurse tried to console her.

## 2016-11-29 ENCOUNTER — Telehealth: Payer: Self-pay | Admitting: Obstetrics & Gynecology

## 2016-11-29 NOTE — Telephone Encounter (Signed)
Called and Left Voicemail For [facility] Referral Line to call back about schedule Pt for referral for Anatomy scan Ob Complete>14 wks , Trans vag if Indicated. Unable to reach pt due to phone number no longer in use.

## 2016-11-30 NOTE — Telephone Encounter (Signed)
Pt is schedule 01/09/17

## 2016-12-02 DIAGNOSIS — B009 Herpesviral infection, unspecified: Secondary | ICD-10-CM | POA: Insufficient documentation

## 2017-01-04 ENCOUNTER — Other Ambulatory Visit: Payer: Self-pay | Admitting: Obstetrics & Gynecology

## 2017-01-04 DIAGNOSIS — Z3492 Encounter for supervision of normal pregnancy, unspecified, second trimester: Secondary | ICD-10-CM

## 2017-01-04 NOTE — Telephone Encounter (Signed)
Not sure what this is for ?

## 2017-01-05 ENCOUNTER — Ambulatory Visit (INDEPENDENT_AMBULATORY_CARE_PROVIDER_SITE_OTHER): Payer: Medicaid Other

## 2017-01-05 DIAGNOSIS — Z3492 Encounter for supervision of normal pregnancy, unspecified, second trimester: Secondary | ICD-10-CM

## 2017-01-09 ENCOUNTER — Other Ambulatory Visit: Payer: Self-pay

## 2017-02-27 ENCOUNTER — Other Ambulatory Visit: Payer: Self-pay | Admitting: Obstetrics & Gynecology

## 2017-02-27 DIAGNOSIS — IMO0002 Reserved for concepts with insufficient information to code with codable children: Secondary | ICD-10-CM

## 2017-02-27 DIAGNOSIS — Z0489 Encounter for examination and observation for other specified reasons: Secondary | ICD-10-CM

## 2017-03-01 ENCOUNTER — Ambulatory Visit (INDEPENDENT_AMBULATORY_CARE_PROVIDER_SITE_OTHER): Payer: Medicaid Other

## 2017-03-01 DIAGNOSIS — IMO0002 Reserved for concepts with insufficient information to code with codable children: Secondary | ICD-10-CM

## 2017-03-01 DIAGNOSIS — Z0489 Encounter for examination and observation for other specified reasons: Secondary | ICD-10-CM

## 2017-03-01 DIAGNOSIS — Z048 Encounter for examination and observation for other specified reasons: Secondary | ICD-10-CM

## 2017-03-20 LAB — HM HIV SCREENING LAB: HM HIV Screening: NEGATIVE

## 2017-04-26 ENCOUNTER — Observation Stay
Admission: EM | Admit: 2017-04-26 | Discharge: 2017-04-26 | Disposition: A | Discharge status: Home / Self Care | Payer: Medicaid Other | Attending: Obstetrics and Gynecology | Admitting: Obstetrics and Gynecology

## 2017-04-26 DIAGNOSIS — O26893 Other specified pregnancy related conditions, third trimester: Secondary | ICD-10-CM | POA: Diagnosis present

## 2017-04-26 DIAGNOSIS — Z3A34 34 weeks gestation of pregnancy: Secondary | ICD-10-CM | POA: Insufficient documentation

## 2017-04-26 DIAGNOSIS — O4703 False labor before 37 completed weeks of gestation, third trimester: Principal | ICD-10-CM | POA: Insufficient documentation

## 2017-04-26 MED ORDER — ACETAMINOPHEN 325 MG PO TABS: 650.0000 mg | ORAL_TABLET | ORAL | Status: DC | PRN

## 2017-04-26 NOTE — OB Triage Note (Signed)
Patient presented to L&D from the ED with complaints of possible ROM since around 1800 last night. Patient states she had one gush of clear fluid around that time but hasn't seen any more fluid since then.  Denies vaginal bleeding or decreased fetal movement.

## 2017-04-26 NOTE — Progress Notes (Signed)
Discharged to home.  Discharge instructions and Labor precautions reviewed, patient verbalized understanding. Patient left with Mother

## 2017-04-26 NOTE — Discharge Summary (Signed)
See final progress note from today's encounter.  Marcelyn BruinsJacelyn Tailor Lucking, CNM 04/26/2017  2:59 PM

## 2017-04-26 NOTE — Final Progress Note (Signed)
Physician Final Progress Note  Patient ID: Henry Demeritt MRN: [id number] DOB/AGE: 29-Mar-1986 31 y.o.  Admit date: 04/26/2017 Admitting provider: Vena Austria, MD Discharge date: 04/26/2017   Admission Diagnoses: Possible leaking fluid, contractions  Discharge Diagnoses:  Active Problems:   Indication for care in labor and delivery, antepartum   History of Present Illness: The patient is a 31 y.o. female [id number] at [redacted]w[redacted]d who presents for possible rupture of membranes and contractions. She states that she felt a gush of fluid last night around 1800 and was having some regular contractions. She came in today to make sure that she was not in preterm labor.  10 point review of systems negative unless otherwise noted in HPI.  Past Medical History:  Diagnosis Date  . Depression     History reviewed. No pertinent surgical history.  No current facility-administered medications on file prior to encounter.    Current Outpatient Prescriptions on File Prior to Encounter  Medication Sig Dispense Refill  . Prenatal Multivit-Min-Fe-FA (PRENATAL VITAMINS) 0.8 MG tablet Take 1 tablet by mouth daily. 90 tablet 3  . HYDROcodone-acetaminophen (NORCO/VICODIN) 5-325 MG tablet Take 1 tablet by mouth every 6 (six) hours as needed for moderate pain. (Patient not taking: Reported on 10/13/2016) 5 tablet 0  . ibuprofen (ADVIL,MOTRIN) 600 MG tablet Take 600 mg by mouth every 6 (six) hours as needed for moderate pain.    Marland Kitchen ibuprofen (ADVIL,MOTRIN) 800 MG tablet Take 1 tablet (800 mg total) by mouth every 8 (eight) hours as needed. (Patient not taking: Reported on 10/13/2016) 30 tablet 0  . ondansetron (ZOFRAN) 4 MG tablet Take 1 tablet (4 mg total) by mouth every 8 (eight) hours as needed for nausea or vomiting. (Patient not taking: Reported on 10/13/2016) 10 tablet 0  . oxyCODONE-acetaminophen (ROXICET) 5-325 MG tablet Take 1 tablet by mouth every 6 (six) hours as needed. (Patient not taking: Reported on  06/13/2016) 20 tablet 0  . predniSONE (DELTASONE) 10 MG tablet Take 6 tablets  today, on day 2 take 5 tablets, day 3 take 4 tablets, day 4 take 3 tablets, day 5 take  2 tablets and 1 tablet the last day (Patient not taking: Reported on 06/13/2016) 21 tablet 0    No Known Allergies  Social History   Social History  . Marital status: Single    Spouse name: N/A  . Number of children: N/A  . Years of education: N/A   Occupational History  . Not on file.   Social History Main Topics  . Smoking status: Current Every Day Smoker  . Smokeless tobacco: Never Used  . Alcohol use No  . Drug use: No  . Sexual activity: Not on file     Comment: planning tubal ligation   Other Topics Concern  . Not on file   Social History Narrative  . No narrative on file    Physical Exam: BP (!) 104/59 (BP Location: Left Arm)   Pulse 82   Temp 98.4 F (36.9 C) (Oral)   Resp 19   LMP 08/25/2016   Gen: NAD CV: RRR Pelvic: SSE, cervix visually closed, no pooling Ext: No signs of DVT Neuro: affect full, alert and oriented  EFM:  Baseline: 155 Variability: moderate Accelerations: present Decelerations: absent Tocometry: some irritability The patient was monitored for 30+ minutes, fetal heart rate tracing was deemed reactive, category I tracing,  Consults: None  Significant Findings/ Diagnostic Studies: Sample taken from SSE showed negative ferning, nitrazine negative for amniotic fluid  Assessment:  No evidence of ruptured membranes. Toco shows uterine irritability but no regular contractions.  Plan: Discharge home with preterm labor precautions.  Discharge Condition: stable  Disposition: 01-Home or Self Care  Diet: Regular diet  Discharge Activity: Activity as tolerated  Discharge Instructions    Discharge activity:  No Restrictions    Complete by:  As directed    Discharge diet:  No restrictions    Complete by:  As directed    Fetal Kick Count:  Lie on our left side for one  hour after a meal, and count the number of times your baby kicks.  If it is less than 5 times, get up, move around and drink some juice.  Repeat the test 30 minutes later.  If it is still less than 5 kicks in an hour, notify your doctor.    Complete by:  As directed    LABOR:  When conractions begin, you should start to time them from the beginning of one contraction to the beginning  of the next.  When contractions are 5 - 10 minutes apart or less and have been regular for at least an hour, you should call your health care provider.    Complete by:  As directed    No sexual activity restrictions    Complete by:  As directed    Notify physician for bleeding from the vagina    Complete by:  As directed    Notify physician for blurring of vision or spots before the eyes    Complete by:  As directed    Notify physician for chills or fever    Complete by:  As directed    Notify physician for fainting spells, "black outs" or loss of consciousness    Complete by:  As directed    Notify physician for increase in vaginal discharge    Complete by:  As directed    Notify physician for leaking of fluid    Complete by:  As directed    Notify physician for pain or burning when urinating    Complete by:  As directed    Notify physician for pelvic pressure (sudden increase)    Complete by:  As directed    Notify physician for severe or continued nausea or vomiting    Complete by:  As directed    Notify physician for sudden gushing of fluid from the vagina (with or without continued leaking)    Complete by:  As directed    Notify physician for sudden, constant, or occasional abdominal pain    Complete by:  As directed    Notify physician if baby moving less than usual    Complete by:  As directed      Allergies as of 04/26/2017   No Known Allergies     Medication List    STOP taking these medications   HYDROcodone-acetaminophen 5-325 MG tablet Commonly known as:  NORCO/VICODIN   ibuprofen 600  MG tablet Commonly known as:  ADVIL,MOTRIN   ibuprofen 800 MG tablet Commonly known as:  ADVIL,MOTRIN   ondansetron 4 MG tablet Commonly known as:  ZOFRAN   oxyCODONE-acetaminophen 5-325 MG tablet Commonly known as:  ROXICET   predniSONE 10 MG tablet Commonly known as:  DELTASONE     TAKE these medications   Prenatal Vitamins 0.8 MG tablet Take 1 tablet by mouth daily.            Discharge Care Instructions        Start  Ordered   04/26/17 0000  LABOR:  When conractions begin, you should start to time them from the beginning of one contraction to the beginning  of the next.  When contractions are 5 - 10 minutes apart or less and have been regular for at least an hour, you should call your health care provider.  Ambulance person(Labor Notify Physician)     04/26/17 1441   04/26/17 0000  Notify physician for bleeding from the vagina  (Labor Notify Physician)     04/26/17 1441   04/26/17 0000  Notify physician for pain or burning when urinating  Ambulance person(Labor Notify Physician)     04/26/17 1441   04/26/17 0000  Notify physician for chills or fever  (Labor Notify Physician)     04/26/17 1441   04/26/17 0000  Notify physician for increase in vaginal discharge  (Labor Notify Physician)     04/26/17 1441   04/26/17 0000  Notify physician for pelvic pressure (sudden increase)  Ambulance person(Labor Notify Physician)     04/26/17 1441   04/26/17 0000  Notify physician if baby moving less than usual  Ambulance person(Labor Notify Physician)     04/26/17 1441   04/26/17 0000  Notify physician for sudden, constant, or occasional abdominal pain  (Labor Notify Physician)     04/26/17 1441   04/26/17 0000  Notify physician for sudden gushing of fluid from the vagina (with or without continued leaking)  Ambulance person(Labor Notify Physician)     04/26/17 1441   04/26/17 0000  Notify physician for leaking of fluid  (Labor Notify Physician)     04/26/17 1441   04/26/17 0000  Notify physician for fainting spells, "black outs" or loss of  consciousness  (Labor Notify Physician)     04/26/17 1441   04/26/17 0000  Notify physician for severe or continued nausea or vomiting  (Labor Notify Physician)     04/26/17 1441   04/26/17 0000  Notify physician for blurring of vision or spots before the eyes  (Labor Notify Physician)     04/26/17 1441   04/26/17 0000  Fetal Kick Count:  Lie on our left side for one hour after a meal, and count the number of times your baby kicks.  If it is less than 5 times, get up, move around and drink some juice.  Repeat the test 30 minutes later.  If it is still less than 5 kicks in an hour, notify your doctor.     04/26/17 1441   04/26/17 0000  Discharge activity:  No Restrictions     04/26/17 1441   04/26/17 0000  No sexual activity restrictions     04/26/17 1441   04/26/17 0000  Discharge diet:  No restrictions     04/26/17 1441      Total time spent taking care of this patient: 15 minutes  Signed: Oswaldo ConroyJacelyn Y Perlie Scheuring, CNM  04/26/2017, 2:47 PM

## 2017-05-15 ENCOUNTER — Observation Stay
Admission: EM | Admit: 2017-05-15 | Discharge: 2017-05-15 | Disposition: A | Discharge status: Home / Self Care | Payer: Medicaid Other | Attending: Obstetrics and Gynecology | Admitting: Obstetrics and Gynecology

## 2017-05-15 DIAGNOSIS — O471 False labor at or after 37 completed weeks of gestation: Secondary | ICD-10-CM

## 2017-05-15 DIAGNOSIS — F17210 Nicotine dependence, cigarettes, uncomplicated: Secondary | ICD-10-CM | POA: Insufficient documentation

## 2017-05-15 DIAGNOSIS — Z3A37 37 weeks gestation of pregnancy: Secondary | ICD-10-CM | POA: Insufficient documentation

## 2017-05-15 LAB — URINE DRUG SCREEN, QUALITATIVE (ARMC ONLY)
AMPHETAMINES, UR SCREEN: NOT DETECTED
BARBITURATES, UR SCREEN: NOT DETECTED
BENZODIAZEPINE, UR SCRN: NOT DETECTED
Cannabinoid 50 Ng, Ur ~~LOC~~: NOT DETECTED
Cocaine Metabolite,Ur ~~LOC~~: NOT DETECTED
MDMA (Ecstasy)Ur Screen: NOT DETECTED
METHADONE SCREEN, URINE: NOT DETECTED
Opiate, Ur Screen: NOT DETECTED
Phencyclidine (PCP) Ur S: NOT DETECTED
TRICYCLIC, UR SCREEN: NOT DETECTED

## 2017-05-15 LAB — TYPE AND SCREEN
ABO/RH(D): A NEG
ANTIBODY SCREEN: NEGATIVE

## 2017-05-15 LAB — CBC
HCT: 32.3 % — ABNORMAL LOW (ref 35.0–47.0)
Hemoglobin: 11.2 g/dL — ABNORMAL LOW (ref 12.0–16.0)
MCH: 29.6 pg (ref 26.0–34.0)
MCHC: 34.8 g/dL (ref 32.0–36.0)
MCV: 85 fL (ref 80.0–100.0)
PLATELETS: 245 10*3/uL (ref 150–440)
RBC: 3.8 MIL/uL (ref 3.80–5.20)
RDW: 13.6 % (ref 11.5–14.5)
WBC: 15.3 10*3/uL — ABNORMAL HIGH (ref 3.6–11.0)

## 2017-05-15 MED ORDER — FENTANYL CITRATE (PF) 100 MCG/2ML IJ SOLN
INTRAMUSCULAR | Status: AC
  Filled 2017-05-15: qty 2

## 2017-05-15 MED ORDER — LACTATED RINGERS IV BOLUS (SEPSIS)
500.0000 mL | Freq: Once | INTRAVENOUS | Status: AC
  Administered 2017-05-15: 500 mL via INTRAVENOUS

## 2017-05-15 MED ORDER — LACTATED RINGERS IV SOLN
INTRAVENOUS | Status: DC
  Administered 2017-05-15 (×2): via INTRAVENOUS

## 2017-05-15 MED ORDER — FENTANYL CITRATE (PF) 100 MCG/2ML IJ SOLN
25.0000 ug | Freq: Once | INTRAMUSCULAR | Status: AC
  Administered 2017-05-15: 25 ug via INTRAVENOUS

## 2017-05-15 NOTE — Final Progress Note (Signed)
Physician Final Progress Note  Patient ID: Natasha Riddle MRN: [id number] DOB/AGE: 31-Feb-1987 31 y.o.  Admit date: 05/15/2017 Admitting provider: Conard Novak, MD Discharge date: 05/15/2017   Admission Diagnoses:  1) intrauterine pregnancy at [redacted]w[redacted]d 2) labor evaluation  Discharge Diagnoses:  1) intrauterine pregnancy at [redacted]w[redacted]d 2) labor evaluation, false labor  History of Present Illness: The patient is a 31 y.o. female [id number] at [redacted]w[redacted]d who presents for regular uterine contractions.  They were painful. She noted +FM, no LOF, no vaginal bleeding.  Contractions frequent.  Pregnancy care at [facility]. Pregnancy complicated by +UDS early in pregnancy for marijuana, tobacco abuse, abnormal glucose tolerance test.  No results available for 3 hour gtt. She is Rh negative.  Pregnancy also complicated by depression/anxiety.    Hospital Course: Patient admitted with frequent contractions with an initial late decelerations.  However, after this her fetal heart rate tracing improved to category 1.  She was monitored for about 5-6 hours total and her contractions decreased and she was no longer feeling them frequently.  Given her overall reassuring fetal tracing and lack of change on serial cervical exams, she was discharged home. Since she did have a deceleration early on due to three straight contractions without break, with subsequent category 1 tracing, she will have a follow up NST on L&D in 2 days.  She was given strict precautions to return to the L&D ASAP if strong contractions returned.   Past Medical History:  Diagnosis Date  . Depression    Past Surgical History: denies  No current facility-administered medications on file prior to encounter.    Current Outpatient Prescriptions on File Prior to Encounter  Medication Sig Dispense Refill  . Prenatal Multivit-Min-Fe-FA (PRENATAL VITAMINS) 0.8 MG tablet Take 1 tablet by mouth daily. 90 tablet 3    Social History   Social History  . Marital  status: Single    Spouse name: N/A  . Number of children: N/A  . Years of education: N/A   Occupational History  . Not on file.   Social History Main Topics  . Smoking status: Current Every Day Smoker    Packs/day: 0.50    Types: Cigarettes  . Smokeless tobacco: Never Used  . Alcohol use No  . Drug use: No  . Sexual activity: Yes    Birth control/ protection: Surgical     Comment: planning tubal ligation   Other Topics Concern  . Not on file   Social History Narrative  . No narrative on file   Family History: denies history of GYN history   Physical Exam: BP 112/66 (BP Location: Left Arm)   Pulse 86   Temp 97.9 F (36.6 C) (Oral)   Resp 20   Ht  (1.6 m)   Wt 205 lb (93 kg)   LMP 08/25/2016   SpO2 100%   BMI 36.31 kg/m   Gen: NAD CV: RRR Pulm: CTAB Pelvic: 1/50/-2 (stable over 6 hours) Ext: no e/c/t  Consults: None  Significant Findings/ Diagnostic Studies: none  Procedures: NST Baseline FHR: 135 beats/min Variability: moderate Accelerations: present Decelerations: early late deceleration with a run of three closely spaced contractions, afterward with moderate variability and +accels.  Patient monitored for 6 hours with an occasional possible late deceleration with closely spaced contractions.  She always had quick return to baseline with immediate moderate variability and accelerations.  Tocometry: 1-2 q 10 min at time of discharge  Interpretation:  INDICATIONS: rule out uterine contractions RESULTS:  A NST procedure  was performed with FHR monitoring and a normal baseline established, appropriate time of 20-40 minutes of evaluation, and accels >2 seen w 15x15 characteristics.  Results show a REACTIVE NST.    Discharge Condition: stable  Disposition: 01-Home or Self Care  Diet: Regular diet  Discharge Activity: Activity as tolerated   Allergies as of 05/15/2017   Not on File     Medication List    TAKE these medications   Prenatal  Vitamins 0.8 MG tablet Take 1 tablet by mouth daily.        Total time spent taking care of this patient: 45 minutes  Signed: Thomasene Mohair, MD  05/15/2017, 8:31 AM

## 2017-05-15 NOTE — Discharge Summary (Signed)
See Final Progress Note 

## 2017-05-15 NOTE — OB Triage Note (Signed)
Pt arrived to unit complaining of contractions starting around 11pm on 05/14/17. Denies leaking of fluid or bleeding. +FM

## 2017-05-17 ENCOUNTER — Observation Stay
Admission: RE | Admit: 2017-05-17 | Discharge: 2017-05-17 | Disposition: A | Discharge status: Home / Self Care | Payer: Medicaid Other | Attending: Obstetrics & Gynecology | Admitting: Obstetrics & Gynecology

## 2017-05-17 DIAGNOSIS — F17210 Nicotine dependence, cigarettes, uncomplicated: Secondary | ICD-10-CM | POA: Insufficient documentation

## 2017-05-17 DIAGNOSIS — F329 Major depressive disorder, single episode, unspecified: Secondary | ICD-10-CM | POA: Diagnosis not present

## 2017-05-17 DIAGNOSIS — O368315 Maternal care for abnormalities of the fetal heart rate or rhythm, first trimester, fetus 5: Secondary | ICD-10-CM | POA: Diagnosis not present

## 2017-05-17 DIAGNOSIS — IMO0002 Reserved for concepts with insufficient information to code with codable children: Secondary | ICD-10-CM | POA: Diagnosis present

## 2017-05-17 DIAGNOSIS — Z3A37 37 weeks gestation of pregnancy: Secondary | ICD-10-CM | POA: Insufficient documentation

## 2017-05-17 LAB — CULTURE, BETA STREP (GROUP B ONLY)

## 2017-05-17 MED ORDER — ONDANSETRON HCL 4 MG/2ML IJ SOLN: 4.0000 mg | Freq: Four times a day (QID) | INTRAMUSCULAR | Status: DC | PRN

## 2017-05-17 MED ORDER — ACETAMINOPHEN 325 MG PO TABS: 650.0000 mg | ORAL_TABLET | ORAL | Status: DC | PRN

## 2017-05-17 NOTE — Final Progress Note (Signed)
Physician Final Progress Note  Patient ID: Natasha Riddle MRN: [id number] DOB/AGE: [birth year] 31 y.o.  Admit date: 05/17/2017 Admitting provider: Nadara Mustard, MD Discharge date: 05/17/2017  Admission Diagnoses: Fetal Heart Rate Abnormality last week, for repeat NST monitroing at 37 weeks  Discharge Diagnoses:  Active Problems:   Abnormal fetal heart rate   Consults: None  Significant Findings/ Diagnostic Studies: Pt is a G3P2 at 37 weeks with [facility] at [facility] without difficulty who had NST Sunday with some mild variances with possible variable decelerations, resolved over time.  Here for repeat NST testing for fetal well being.  Good fetal movement.  Denies VB, ROM, pain, or ctxs.  PMHx: She  has a past medical history of Depression. Also,  has no past surgical history on file., family history is not on file.,  reports that she has been smoking Cigarettes.  She has been smoking about 0.50 packs per day. She has never used smokeless tobacco. She reports that she does not drink alcohol or use drugs. Current Meds  Medication Sig  . Prenatal Multivit-Min-Fe-FA (PRENATAL VITAMINS) 0.8 MG tablet Take 1 tablet by mouth daily.  Also, has No Known Allergies..  Review of Systems  Constitutional: Negative for chills, fever and malaise/fatigue.  HENT: Negative for congestion, sinus pain and sore throat.   Eyes: Negative for blurred vision and pain.  Respiratory: Negative for cough and wheezing.   Cardiovascular: Negative for chest pain and leg swelling.  Gastrointestinal: Negative for abdominal pain, constipation, diarrhea, heartburn, nausea and vomiting.  Genitourinary: Negative for dysuria, frequency, hematuria and urgency.  Musculoskeletal: Negative for back pain, joint pain, myalgias and neck pain.  Skin: Negative for itching and rash.  Neurological: Negative for dizziness, tremors and weakness.  Endo/Heme/Allergies: Does not bruise/bleed easily.  Psychiatric/Behavioral: Negative for  depression. The patient is not nervous/anxious and does not have insomnia.   Physical examination Constitutional NAD, Conversant  Skin No rashes, lesions or ulceration. Normal palpated skin turgor. No nodularity.  Lungs: Clear to auscultation.No rales or wheezes. Normal Respiratory effort, no retractions.  Heart: NSR.  No murmurs or rubs appreciated. No periferal edema  Abdomen: Gravid.  Non-tender.  No masses.  No HSM. No hernia  Extremities: Moves all appropriately.  Normal ROM for age. No lymphadenopathy.  Neuro: Grossly intact  Psych: Oriented to PPT.  Normal mood. Normal affect.   Procedures: A NST procedure was performed with FHR monitoring and a normal baseline established, appropriate time of 20-40 minutes of evaluation, and accels >2 seen w 15x15 characteristics.  Results show a REACTIVE NST.   Discharge Condition: good  Disposition: 01-Home or Self Care  Diet: Regular diet  Discharge Activity: Activity as tolerated   Allergies as of 05/17/2017   No Known Allergies     Medication List    TAKE these medications   Prenatal Vitamins 0.8 MG tablet Take 1 tablet by mouth daily.      Follow-up Information    Department, [facility]. Go in 1 week(s).   Contact information: [address] [place] [address] [phone]          Total time spent taking care of this patient: 15 minutes  Signed: Letitia Libra 05/17/2017, 9:56 AM

## 2017-05-17 NOTE — OB Triage Note (Signed)
Here for NST

## 2017-05-17 NOTE — Discharge Summary (Signed)
  See FPN 

## 2017-05-17 NOTE — Discharge Instructions (Signed)

## 2017-05-29 ENCOUNTER — Other Ambulatory Visit: Payer: Self-pay | Admitting: Obstetrics & Gynecology

## 2017-06-08 ENCOUNTER — Inpatient Hospital Stay
Admission: EM | Admit: 2017-06-08 | Discharge: 2017-06-10 | DRG: 797 | Disposition: A | Discharge status: Home / Self Care | Payer: Medicaid Other | Attending: Obstetrics & Gynecology | Admitting: Obstetrics & Gynecology

## 2017-06-08 DIAGNOSIS — O480 Post-term pregnancy: Principal | ICD-10-CM | POA: Diagnosis present

## 2017-06-08 DIAGNOSIS — Z3A39 39 weeks gestation of pregnancy: Secondary | ICD-10-CM | POA: Diagnosis not present

## 2017-06-08 DIAGNOSIS — O9081 Anemia of the puerperium: Secondary | ICD-10-CM | POA: Diagnosis not present

## 2017-06-08 DIAGNOSIS — D62 Acute posthemorrhagic anemia: Secondary | ICD-10-CM | POA: Diagnosis not present

## 2017-06-08 DIAGNOSIS — Z302 Encounter for sterilization: Secondary | ICD-10-CM

## 2017-06-08 DIAGNOSIS — Z3A41 41 weeks gestation of pregnancy: Secondary | ICD-10-CM

## 2017-06-08 LAB — CBC
HCT: 35.6 % (ref 35.0–47.0)
HEMOGLOBIN: 12.1 g/dL (ref 12.0–16.0)
MCH: 28.9 pg (ref 26.0–34.0)
MCHC: 34.1 g/dL (ref 32.0–36.0)
MCV: 84.7 fL (ref 80.0–100.0)
PLATELETS: 283 10*3/uL (ref 150–440)
RBC: 4.2 MIL/uL (ref 3.80–5.20)
RDW: 14.1 % (ref 11.5–14.5)
WBC: 14.5 10*3/uL — AB (ref 3.6–11.0)

## 2017-06-08 LAB — TYPE AND SCREEN
ABO/RH(D): A NEG
Antibody Screen: NEGATIVE

## 2017-06-08 LAB — URINE DRUG SCREEN, QUALITATIVE (ARMC ONLY)
Amphetamines, Ur Screen: NOT DETECTED
Barbiturates, Ur Screen: NOT DETECTED
Benzodiazepine, Ur Scrn: NOT DETECTED
Cannabinoid 50 Ng, Ur ~~LOC~~: NOT DETECTED
Cocaine Metabolite,Ur ~~LOC~~: NOT DETECTED
MDMA (Ecstasy)Ur Screen: NOT DETECTED
Methadone Scn, Ur: NOT DETECTED
Opiate, Ur Screen: NOT DETECTED
Phencyclidine (PCP) Ur S: NOT DETECTED
Tricyclic, Ur Screen: POSITIVE — AB

## 2017-06-08 MED ORDER — SODIUM CHLORIDE 0.9 % IV SOLN
2.0000 g | Freq: Once | INTRAVENOUS | Status: AC
  Administered 2017-06-08: 2 g via INTRAVENOUS

## 2017-06-08 MED ORDER — TETANUS-DIPHTH-ACELL PERTUSSIS 5-2.5-18.5 LF-MCG/0.5 IM SUSP: 0.5000 mL | Freq: Once | INTRAMUSCULAR | Status: DC

## 2017-06-08 MED ORDER — DIPHENHYDRAMINE HCL 25 MG PO CAPS: 25.0000 mg | ORAL_CAPSULE | Freq: Four times a day (QID) | ORAL | Status: DC | PRN

## 2017-06-08 MED ORDER — TERBUTALINE SULFATE 1 MG/ML IJ SOLN: 0.2500 mg | Freq: Once | INTRAMUSCULAR | Status: DC | PRN

## 2017-06-08 MED ORDER — COCONUT OIL OIL: 1.0000 "application " | TOPICAL_OIL | Status: DC | PRN

## 2017-06-08 MED ORDER — ONDANSETRON HCL 4 MG PO TABS: 4.0000 mg | ORAL_TABLET | ORAL | Status: DC | PRN

## 2017-06-08 MED ORDER — SODIUM CHLORIDE 0.9 % IV SOLN
2.0000 g | Freq: Four times a day (QID) | INTRAVENOUS | Status: DC
  Filled 2017-06-08: qty 2000

## 2017-06-08 MED ORDER — FAMOTIDINE 20 MG PO TABS
40.0000 mg | ORAL_TABLET | Freq: Once | ORAL | Status: AC
  Administered 2017-06-10: 40 mg via ORAL
  Filled 2017-06-08: qty 2

## 2017-06-08 MED ORDER — SENNOSIDES-DOCUSATE SODIUM 8.6-50 MG PO TABS
2.0000 | ORAL_TABLET | ORAL | Status: DC
  Administered 2017-06-09 – 2017-06-10 (×2): 2 via ORAL
  Filled 2017-06-08 (×2): qty 2

## 2017-06-08 MED ORDER — SODIUM CHLORIDE 0.9 % IV SOLN
1.0000 g | INTRAVENOUS | Status: DC
  Filled 2017-06-08 (×4): qty 1000

## 2017-06-08 MED ORDER — IBUPROFEN 600 MG PO TABS
ORAL_TABLET | ORAL | Status: AC
  Administered 2017-06-08: 600 mg via ORAL
  Filled 2017-06-08: qty 1

## 2017-06-08 MED ORDER — LACTATED RINGERS IV SOLN: 500.0000 mL | INTRAVENOUS | Status: DC | PRN

## 2017-06-08 MED ORDER — MISOPROSTOL 25 MCG QUARTER TABLET
25.0000 ug | ORAL_TABLET | ORAL | Status: DC | PRN
  Administered 2017-06-08: 25 ug via VAGINAL
  Filled 2017-06-08: qty 1

## 2017-06-08 MED ORDER — ACETAMINOPHEN 325 MG PO TABS: 650.0000 mg | ORAL_TABLET | ORAL | Status: DC | PRN

## 2017-06-08 MED ORDER — SIMETHICONE 80 MG PO CHEW: 80.0000 mg | CHEWABLE_TABLET | ORAL | Status: DC | PRN

## 2017-06-08 MED ORDER — WITCH HAZEL-GLYCERIN EX PADS: 1.0000 "application " | MEDICATED_PAD | CUTANEOUS | Status: DC | PRN

## 2017-06-08 MED ORDER — PRENATAL MULTIVITAMIN CH
1.0000 | ORAL_TABLET | Freq: Every day | ORAL | Status: DC
  Administered 2017-06-08 – 2017-06-10 (×3): 1 via ORAL
  Filled 2017-06-08 (×3): qty 1

## 2017-06-08 MED ORDER — DIBUCAINE 1 % RE OINT: 1.0000 "application " | TOPICAL_OINTMENT | RECTAL | Status: DC | PRN

## 2017-06-08 MED ORDER — LACTATED RINGERS IV SOLN
INTRAVENOUS | Status: DC
  Administered 2017-06-08: 06:00:00 via INTRAVENOUS

## 2017-06-08 MED ORDER — OXYTOCIN BOLUS FROM INFUSION
500.0000 mL | Freq: Once | INTRAVENOUS | Status: AC
  Administered 2017-06-08: 500 mL via INTRAVENOUS

## 2017-06-08 MED ORDER — OXYTOCIN 40 UNITS IN LACTATED RINGERS INFUSION - SIMPLE MED
2.5000 [IU]/h | INTRAVENOUS | Status: DC
  Administered 2017-06-08: 2.5 [IU]/h via INTRAVENOUS

## 2017-06-08 MED ORDER — OXYTOCIN 40 UNITS IN LACTATED RINGERS INFUSION - SIMPLE MED
INTRAVENOUS | Status: AC
  Administered 2017-06-08: 2.5 [IU]/h via INTRAVENOUS
  Filled 2017-06-08: qty 1000

## 2017-06-08 MED ORDER — OXYCODONE-ACETAMINOPHEN 5-325 MG PO TABS
2.0000 | ORAL_TABLET | ORAL | Status: DC | PRN
  Administered 2017-06-08 – 2017-06-10 (×10): 2 via ORAL
  Filled 2017-06-08 (×10): qty 2

## 2017-06-08 MED ORDER — BENZOCAINE-MENTHOL 20-0.5 % EX AERO: 1.0000 "application " | INHALATION_SPRAY | CUTANEOUS | Status: DC | PRN

## 2017-06-08 MED ORDER — OXYCODONE-ACETAMINOPHEN 5-325 MG PO TABS: 1.0000 | ORAL_TABLET | ORAL | Status: DC | PRN

## 2017-06-08 MED ORDER — ONDANSETRON HCL 4 MG/2ML IJ SOLN: 4.0000 mg | Freq: Four times a day (QID) | INTRAMUSCULAR | Status: DC | PRN

## 2017-06-08 MED ORDER — BUTORPHANOL TARTRATE 1 MG/ML IJ SOLN
1.0000 mg | INTRAMUSCULAR | Status: DC | PRN
  Administered 2017-06-08: 1 mg via INTRAVENOUS
  Filled 2017-06-08: qty 1

## 2017-06-08 MED ORDER — IBUPROFEN 600 MG PO TABS
600.0000 mg | ORAL_TABLET | Freq: Four times a day (QID) | ORAL | Status: DC
  Administered 2017-06-08 – 2017-06-10 (×9): 600 mg via ORAL
  Filled 2017-06-08 (×8): qty 1

## 2017-06-08 MED ORDER — LACTATED RINGERS IV SOLN
INTRAVENOUS | Status: DC
  Administered 2017-06-10: 20 mL/h via INTRAVENOUS
  Administered 2017-06-10: 08:00:00 via INTRAVENOUS

## 2017-06-08 MED ORDER — METOCLOPRAMIDE HCL 10 MG PO TABS
10.0000 mg | ORAL_TABLET | Freq: Once | ORAL | Status: AC
  Administered 2017-06-10: 10 mg via ORAL
  Filled 2017-06-08: qty 1

## 2017-06-08 MED ORDER — ONDANSETRON HCL 4 MG/2ML IJ SOLN
4.0000 mg | INTRAMUSCULAR | Status: DC | PRN
  Administered 2017-06-10: 4 mg via INTRAVENOUS

## 2017-06-08 NOTE — H&P (Signed)
Obstetrics Admission History & Physical   Scheduled Induction   HPI:  31 y.o. [id number] @ 4476w0d (06/01/2017, by Patient Reported). Admitted on 06/08/2017:   Patient Active Problem List   Diagnosis Date Noted  . Post term pregnancy 06/08/2017  . Abnormal fetal heart rate 05/17/2017  . Labor and delivery, indication for care 05/15/2017  . Indication for care in labor and delivery, antepartum 04/26/2017    Presents for IOL for Postdates.   Prenatal care at: at [facility]. Pregnancy complicated by none.  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  ROS: A review of systems was performed and negative, except as stated in the above HPI. PMHx:  Past Medical History:  Diagnosis Date  . Depression    PSHx: History reviewed. No pertinent surgical history. Medications:  Prescriptions Prior to Admission  Medication Sig Dispense Refill Last Dose  . acyclovir (ZOVIRAX) 400 MG tablet Take 400 mg by mouth 2 (two) times daily.     Marland Kitchen. amitriptyline (ELAVIL) 25 MG tablet Take 25 mg by mouth at bedtime.     . Prenatal Multivit-Min-Fe-FA (PRENATAL VITAMINS) 0.8 MG tablet Take 1 tablet by mouth daily. 90 tablet 3 05/16/2017   Allergies: has No Known Allergies. OBHx:  OB History  Gravida Para Term Preterm AB Living  5 2 2   2 2   SAB TAB Ectopic Multiple Live Births  2       2    # Outcome Date GA Lbr Len/2nd Weight Sex Delivery Anes PTL Lv  5 Current           4 Term 09/03/08    F Vag-Spont EPI N LIV  3 Term 08/26/04    M Vag-Spont EPI  LIV  2 SAB           1 SAB              ZHY:QMVHQION/[id number]:Negative/unremarkable except as detailed in HPI.Marland Kitchen.  No family history of birth defects. Soc Hx: Alcohol: none, Recreational drug use: former and Denies domestic abuse  Objective:   Vitals:   06/08/17 0544  BP: 115/78  Pulse: 95  Resp: 20  Temp: 98.1 F (36.7 C)   Constitutional: Well nourished, well developed female in no acute distress.  HEENT: normal Skin: Warm and dry.   Cardiovascular:Regular rate and rhythm.   Extremity: trace to 1+ bilateral pedal edema Respiratory: Clear to auscultation bilateral. Normal respiratory effort Abdomen: marked Back: no CVAT Neuro: DTRs 2+, Cranial nerves grossly intact Psych: Alert and Oriented x3. No memory deficits. Normal mood and affect.  MS: normal gait, normal bilateral lower extremity ROM/strength/stability.  Pelvic exam: is not limited by body habitus EGBUS: within normal limits Vagina: within normal limits and with normal mucosa blood in the vault Cervix: 4/80/-2 Uterus: Spontaneous uterine activity  Adnexa: not evaluated  EFM:FHR: 140 bpm, variability: moderate,  accelerations:  Present,  decelerations:  Absent Toco: Frequency: Every 2-3 minutes   Perinatal info:  Blood type: A negative Rubella- Immune Varicella -Immune TDaP Given during third trimester of this pregnancy RPR NR / HIV Neg/ HBsAg Neg   Assessment & Plan:   31 y.o. 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  Pitocin as needed.  Annamarie Major, MD, [facility], [facility] 06/08/2017  7:50 AM

## 2017-06-08 NOTE — Progress Notes (Signed)
Spoke with Dr.Harris. Notified of patients arrival on unit, SVE, GBS + status, and history of HSV. Patient has been taking acyclovir 400mg  BID for the past few weeks. Denies any lesions. Per Dr.Harris proceed with vaginal cytotec for IOL.

## 2017-06-08 NOTE — Discharge Summary (Signed)
Obstetric Discharge Summary Reason for Admission: induction of labor Prenatal Procedures: none Intrapartum Procedures: spontaneous vaginal delivery Postpartum Procedures: P.P. tubal ligation Complications-Operative and Postpartum: none Hemoglobin  Date Value Ref Range Status  06/09/2017 11.3 (L) 12.0 - 16.0 g/dL Final   HCT  Date Value Ref Range Status  06/09/2017 32.8 (L) 35.0 - 47.0 % Final    Physical Exam:  General: alert, cooperative, appears stated age and no distress Lochia: appropriate Uterine Fundus: firm Incision: healing well DVT Evaluation: No evidence of DVT seen on physical exam.  Discharge Diagnoses: Term Pregnancy-delivered  Discharge Information: Date: 06/10/2017 Activity: pelvic rest Diet: routine Allergies as of 06/10/2017   No Known Allergies     Medication List    TAKE these medications   acyclovir 400 MG tablet Commonly known as:  ZOVIRAX Take 400 mg by mouth 2 (two) times daily.   amitriptyline 25 MG tablet Commonly known as:  ELAVIL Take 25 mg by mouth at bedtime.   oxyCODONE-acetaminophen 5-325 MG tablet Commonly known as:  PERCOCET/ROXICET Take 1 tablet by mouth every 4 (four) hours as needed (pain scale 4-7).   Prenatal Vitamins 0.8 MG tablet Take 1 tablet by mouth daily.       Condition: stable Discharge to: home Follow-up Information    Nadara MustardHarris, Hatcher Froning P, MD. Schedule an appointment as soon as possible for a visit in 2 week(s).   Specialty:  Obstetrics and Gynecology Why:  Post Op from tubal  Contact information: [address] [place] [address] [phone]        Department, [facility]. Schedule an appointment as soon as possible for a visit in 6 week(s).   Why:  Post Partum Contact information: [address] [place] [phone]           Newborn Data: Live born Female  Birth Weight: 9 lb 3.8 oz (4190 g) APGAR: 7, 8  Newborn Delivery   Birth  date/time:  06/08/2017 08:38:00 Delivery type:  Vaginal, Spontaneous Delivery     Home with mother.  Natasha Libraobert Paul Williemae Muriel 06/10/2017, 8:39 AM

## 2017-06-09 DIAGNOSIS — D62 Acute posthemorrhagic anemia: Secondary | ICD-10-CM

## 2017-06-09 DIAGNOSIS — O9081 Anemia of the puerperium: Secondary | ICD-10-CM

## 2017-06-09 LAB — CBC
HEMATOCRIT: 32.8 % — AB (ref 35.0–47.0)
HEMOGLOBIN: 11.3 g/dL — AB (ref 12.0–16.0)
MCH: 29.4 pg (ref 26.0–34.0)
MCHC: 34.6 g/dL (ref 32.0–36.0)
MCV: 85 fL (ref 80.0–100.0)
Platelets: 277 10*3/uL (ref 150–440)
RBC: 3.86 MIL/uL (ref 3.80–5.20)
RDW: 13.9 % (ref 11.5–14.5)
WBC: 14.1 10*3/uL — ABNORMAL HIGH (ref 3.6–11.0)

## 2017-06-09 LAB — FETAL SCREEN: FETAL SCREEN: NEGATIVE

## 2017-06-09 LAB — RPR: RPR Ser Ql: NONREACTIVE

## 2017-06-09 MED ORDER — RHO D IMMUNE GLOBULIN 1500 UNIT/2ML IJ SOSY
300.0000 ug | PREFILLED_SYRINGE | Freq: Once | INTRAMUSCULAR | Status: AC
  Administered 2017-06-09: 300 ug via INTRAVENOUS
  Filled 2017-06-09: qty 2

## 2017-06-09 NOTE — Progress Notes (Signed)
PPD#1 SVD Subjective:  Patient is ambulating in room. Alert and oriented. Asking questions about normal newborn behaviors. Denies dizziness. Pain control acceptable with PRN medication. Voiding without difficulty. Tolerating a regular diet.   Objective:   Blood pressure 114/66, pulse 81, temperature 98 F (36.7 C), temperature source Oral, resp. rate 18, height 5\' 3"  (1.6 m), weight 214 lb (97.1 kg), last menstrual period 08/25/2016, SpO2 98 %, not currently breastfeeding.  General: NAD Pulmonary: no increased work of breathing Abdomen: non-distended, non-tender Uterus:  fundus firm at U/1; lochia equal to menses Laceration: well approximated, swollen, using ice packs Extremities: no edema, no erythema, no tenderness, no signs of DVT  Results for orders placed or performed during the hospital encounter of 06/08/17 (from the past 72 hour(s))  CBC     Status: Abnormal   Collection Time: 06/08/17  7:32 AM  Result Value Ref Range   WBC 14.5 (H) 3.6 - 11.0 K/uL   RBC 4.20 3.80 - 5.20 MIL/uL   Hemoglobin 12.1 12.0 - 16.0 g/dL   HCT 16.135.6 09.635.0 - 04.547.0 %   MCV 84.7 80.0 - 100.0 fL   MCH 28.9 26.0 - 34.0 pg   MCHC 34.1 32.0 - 36.0 g/dL   RDW 40.914.1 81.111.5 - 91.414.5 %   Platelets 283 150 - 440 K/uL  RPR     Status: None   Collection Time: 06/08/17  7:32 AM  Result Value Ref Range   RPR Ser Ql Non Reactive Non Reactive    Comment: (NOTE) Performed At: [facility] [address] [place], [place] [id number] Mila HomerHancock William F MD YQ:[phone]   Type and screen     Status: None   Collection Time: 06/08/17  7:32 AM  Result Value Ref Range   ABO/RH(D) A NEG    Antibody Screen NEG    Sample Expiration 06/11/2017   Urine Drug Screen, Qualitative ([facility] only)     Status: Abnormal   Collection Time: 06/08/17  8:01 AM  Result Value Ref Range   Tricyclic, Ur Screen POSITIVE (A) NONE DETECTED   Amphetamines, Ur Screen NONE DETECTED NONE DETECTED   MDMA (Ecstasy)Ur Screen NONE DETECTED NONE  DETECTED   Cocaine Metabolite,Ur [place] NONE DETECTED NONE DETECTED   Opiate, Ur Screen NONE DETECTED NONE DETECTED   Phencyclidine (PCP) Ur S NONE DETECTED NONE DETECTED   Cannabinoid 50 Ng, Ur [place] NONE DETECTED NONE DETECTED   Barbiturates, Ur Screen NONE DETECTED NONE DETECTED   Benzodiazepine, Ur Scrn NONE DETECTED NONE DETECTED   Methadone Scn, Ur NONE DETECTED NONE DETECTED    Comment: (NOTE) 100  Tricyclics, urine               Cutoff 1000 ng/mL 200  Amphetamines, urine             Cutoff 1000 ng/mL 300  MDMA (Ecstasy), urine           Cutoff 500 ng/mL 400  Cocaine Metabolite, urine       Cutoff 300 ng/mL 500  Opiate, urine                   Cutoff 300 ng/mL 600  Phencyclidine (PCP), urine      Cutoff 25 ng/mL 700  Cannabinoid, urine              Cutoff 50 ng/mL 800  Barbiturates, urine             Cutoff 200 ng/mL 900  Benzodiazepine, urine  Cutoff 200 ng/mL 1000 Methadone, urine                Cutoff 300 ng/mL 1100 1200 The urine drug screen provides only a preliminary, unconfirmed 1300 analytical test result and should not be used for non-medical 1400 purposes. Clinical consideration and professional judgment should 1500 be applied to any positive drug screen result due to possible 1600 interfering substances. A more specific alternate chemical method 1700 must be used in order to obtain a confirmed analytical result.  1800 Gas chromato graphy / mass spectrometry (GC/MS) is the preferred 1900 confirmatory method.   Fetal screen     Status: None   Collection Time: 06/09/17  5:45 AM  Result Value Ref Range   Fetal Screen NEG   Rhogam injection     Status: None (Preliminary result)   Collection Time: 06/09/17  5:45 AM  Result Value Ref Range   Unit Number [id number]    Blood Component Type RHIG    Unit division 00    Status of Unit ISSUED    Transfusion Status OK TO TRANSFUSE   CBC     Status: Abnormal   Collection Time: 06/09/17  5:45 AM  Result Value Ref  Range   WBC 14.1 (H) 3.6 - 11.0 K/uL   RBC 3.86 3.80 - 5.20 MIL/uL   Hemoglobin 11.3 (L) 12.0 - 16.0 g/dL   HCT 81.1 (L) 91.4 - 78.2 %   MCV 85.0 80.0 - 100.0 fL   MCH 29.4 26.0 - 34.0 pg   MCHC 34.6 32.0 - 36.0 g/dL   RDW 95.6 21.3 - 08.6 %   Platelets 277 150 - 440 K/uL    Assessment:   31 y.o. [id number] postpartum day # 1 following SVD in stable condition.  Plan:   1) Acute blood loss anemia - hemodynamically stable and asymptomatic - PO ferrous sulfate  2) Blood Type --/--/A NEG (10/18 0732) /  Information for the patient's newborn:  Janya, Eveland [[id number]]  AB POS RhIG indicated and given.  3) Rubella immune / Varicella immune / TDAP status: given 03/20/2017  4) Formula feeding  5) Contraception: desires BTL during inpatient admission  6) Disposition: NPO after midnight for BTL. Continue postpartum care.  Marcelyn Bruins, CNM 06/09/2017  9:50 AM

## 2017-06-10 ENCOUNTER — Inpatient Hospital Stay: Payer: Medicaid Other | Admitting: Anesthesiology

## 2017-06-10 ENCOUNTER — Encounter: Payer: Self-pay | Admitting: Anesthesiology

## 2017-06-10 ENCOUNTER — Encounter
Admission: EM | Disposition: A | Discharge status: Home / Self Care | Payer: Self-pay | Source: Home / Self Care | Attending: Obstetrics & Gynecology

## 2017-06-10 DIAGNOSIS — Z302 Encounter for sterilization: Secondary | ICD-10-CM

## 2017-06-10 HISTORY — PX: TUBAL LIGATION: SHX77

## 2017-06-10 LAB — RHOGAM INJECTION: Unit division: 0

## 2017-06-10 SURGERY — LIGATION, FALLOPIAN TUBE, POSTPARTUM
Anesthesia: General

## 2017-06-10 MED ORDER — HYDROMORPHONE HCL 1 MG/ML IJ SOLN
INTRAMUSCULAR | Status: AC
  Filled 2017-06-10: qty 1

## 2017-06-10 MED ORDER — PROPOFOL 10 MG/ML IV BOLUS
INTRAVENOUS | Status: DC | PRN
  Administered 2017-06-10: 150 mg via INTRAVENOUS

## 2017-06-10 MED ORDER — LIDOCAINE HCL (CARDIAC) 20 MG/ML IV SOLN
INTRAVENOUS | Status: DC | PRN
  Administered 2017-06-10: 100 mg via INTRAVENOUS

## 2017-06-10 MED ORDER — FENTANYL CITRATE (PF) 100 MCG/2ML IJ SOLN
INTRAMUSCULAR | Status: DC | PRN
  Administered 2017-06-10: 100 ug via INTRAVENOUS

## 2017-06-10 MED ORDER — HYDROMORPHONE HCL 1 MG/ML IJ SOLN
0.5000 mg | INTRAMUSCULAR | Status: AC | PRN
  Administered 2017-06-10 (×4): 0.5 mg via INTRAVENOUS

## 2017-06-10 MED ORDER — BUPIVACAINE HCL (PF) 0.5 % IJ SOLN
INTRAMUSCULAR | Status: AC
  Filled 2017-06-10: qty 30

## 2017-06-10 MED ORDER — BUPIVACAINE HCL 0.5 % IJ SOLN
INTRAMUSCULAR | Status: DC | PRN
  Administered 2017-06-10: 10 mL

## 2017-06-10 MED ORDER — SUGAMMADEX SODIUM 200 MG/2ML IV SOLN
INTRAVENOUS | Status: AC
  Filled 2017-06-10: qty 2

## 2017-06-10 MED ORDER — ACETAMINOPHEN 325 MG PO TABS
650.0000 mg | ORAL_TABLET | Freq: Once | ORAL | Status: AC
  Administered 2017-06-10: 650 mg via ORAL

## 2017-06-10 MED ORDER — ONDANSETRON HCL 4 MG/2ML IJ SOLN
INTRAMUSCULAR | Status: AC
  Filled 2017-06-10: qty 2

## 2017-06-10 MED ORDER — FENTANYL CITRATE (PF) 100 MCG/2ML IJ SOLN
INTRAMUSCULAR | Status: AC
  Filled 2017-06-10: qty 2

## 2017-06-10 MED ORDER — MIDAZOLAM HCL 2 MG/2ML IJ SOLN
INTRAMUSCULAR | Status: DC | PRN
  Administered 2017-06-10: 2 mg via INTRAVENOUS

## 2017-06-10 MED ORDER — ACETAMINOPHEN 325 MG PO TABS
ORAL_TABLET | ORAL | Status: DC
Start: 2017-06-10 — End: 2017-06-10
  Filled 2017-06-10: qty 2

## 2017-06-10 MED ORDER — SUCCINYLCHOLINE CHLORIDE 20 MG/ML IJ SOLN
INTRAMUSCULAR | Status: DC | PRN
  Administered 2017-06-10: 100 mg via INTRAVENOUS

## 2017-06-10 MED ORDER — FENTANYL CITRATE (PF) 100 MCG/2ML IJ SOLN
25.0000 ug | INTRAMUSCULAR | Status: DC | PRN
  Administered 2017-06-10 (×4): 25 ug via INTRAVENOUS

## 2017-06-10 MED ORDER — ONDANSETRON HCL 4 MG/2ML IJ SOLN: 4.0000 mg | Freq: Once | INTRAMUSCULAR | Status: DC | PRN

## 2017-06-10 MED ORDER — OXYCODONE-ACETAMINOPHEN 5-325 MG PO TABS: 1.0000 | ORAL_TABLET | ORAL | 0 refills | Status: DC | PRN

## 2017-06-10 MED ORDER — ROCURONIUM BROMIDE 100 MG/10ML IV SOLN
INTRAVENOUS | Status: DC | PRN
  Administered 2017-06-10: 40 mg via INTRAVENOUS
  Administered 2017-06-10: 10 mg via INTRAVENOUS

## 2017-06-10 MED ORDER — PROPOFOL 10 MG/ML IV BOLUS
INTRAVENOUS | Status: AC
  Filled 2017-06-10: qty 20

## 2017-06-10 MED ORDER — PHENYLEPHRINE HCL 10 MG/ML IJ SOLN
INTRAMUSCULAR | Status: AC
  Filled 2017-06-10: qty 1

## 2017-06-10 MED ORDER — MIDAZOLAM HCL 2 MG/2ML IJ SOLN
INTRAMUSCULAR | Status: AC
  Filled 2017-06-10: qty 2

## 2017-06-10 MED ORDER — SUCCINYLCHOLINE CHLORIDE 20 MG/ML IJ SOLN
INTRAMUSCULAR | Status: AC
  Filled 2017-06-10: qty 1

## 2017-06-10 MED ORDER — ROCURONIUM BROMIDE 50 MG/5ML IV SOLN
INTRAVENOUS | Status: AC
  Filled 2017-06-10: qty 1

## 2017-06-10 MED ORDER — DEXAMETHASONE SODIUM PHOSPHATE 10 MG/ML IJ SOLN
INTRAMUSCULAR | Status: DC | PRN
  Administered 2017-06-10: 10 mg via INTRAVENOUS

## 2017-06-10 MED ORDER — SUGAMMADEX SODIUM 200 MG/2ML IV SOLN
INTRAVENOUS | Status: DC | PRN
  Administered 2017-06-10: 194.2 mg via INTRAVENOUS

## 2017-06-10 MED ORDER — OXYCODONE HCL 5 MG PO TABS
5.0000 mg | ORAL_TABLET | Freq: Once | ORAL | Status: AC
  Administered 2017-06-10: 5 mg via ORAL
  Filled 2017-06-10: qty 1

## 2017-06-10 MED ORDER — DEXAMETHASONE SODIUM PHOSPHATE 10 MG/ML IJ SOLN
INTRAMUSCULAR | Status: AC
  Filled 2017-06-10: qty 1

## 2017-06-10 MED ORDER — LIDOCAINE HCL (PF) 2 % IJ SOLN
INTRAMUSCULAR | Status: AC
  Filled 2017-06-10: qty 10

## 2017-06-10 SURGICAL SUPPLY — 23 items
CHLORAPREP W/TINT 26ML (MISCELLANEOUS) ×3 IMPLANT
DERMABOND ADVANCED (GAUZE/BANDAGES/DRESSINGS) ×2
DERMABOND ADVANCED .7 DNX12 (GAUZE/BANDAGES/DRESSINGS) ×1 IMPLANT
DRAPE LAPAROTOMY T 102X78X121 (DRAPES) ×3 IMPLANT
DRSG TEGADERM 2-3/8X2-3/4 SM (GAUZE/BANDAGES/DRESSINGS) IMPLANT
DRSG TELFA 4X3 1S NADH ST (GAUZE/BANDAGES/DRESSINGS) IMPLANT
ELECT CAUTERY BLADE 6.4 (BLADE) ×3 IMPLANT
ELECT REM PT RETURN 9FT ADLT (ELECTROSURGICAL) ×3
ELECTRODE REM PT RTRN 9FT ADLT (ELECTROSURGICAL) ×1 IMPLANT
GLOVE BIO SURGEON STRL SZ8 (GLOVE) ×3 IMPLANT
GOWN STRL REUS W/ TWL LRG LVL3 (GOWN DISPOSABLE) ×1 IMPLANT
GOWN STRL REUS W/ TWL XL LVL3 (GOWN DISPOSABLE) ×1 IMPLANT
GOWN STRL REUS W/TWL LRG LVL3 (GOWN DISPOSABLE) ×2
GOWN STRL REUS W/TWL XL LVL3 (GOWN DISPOSABLE) ×2
LABEL OR SOLS (LABEL) ×3 IMPLANT
NDL SAFETY 22GX1.5 (NEEDLE) ×3 IMPLANT
NS IRRIG 500ML POUR BTL (IV SOLUTION) ×3 IMPLANT
PACK BASIN MINOR ARMC (MISCELLANEOUS) ×3 IMPLANT
STRAP SAFETY BODY (MISCELLANEOUS) ×3 IMPLANT
SUT VIC AB 0 SH 27 (SUTURE) ×3 IMPLANT
SUT VIC AB 2-0 UR6 27 (SUTURE) ×3 IMPLANT
SUT VIC AB 4-0 PS2 18 (SUTURE) IMPLANT
SYRINGE 10CC LL (SYRINGE) ×3 IMPLANT

## 2017-06-10 NOTE — Progress Notes (Signed)
Admit Date: 06/08/2017 Today's Date: 06/10/2017  Post Partum Day 2  Subjective:  no complaints  Objective: Temp:  [98 F (36.7 C)-98.9 F (37.2 C)] 98.2 F (36.8 C) (10/20 0719) Pulse Rate:  [81-91] 82 (10/20 0719) Resp:  [18-20] 20 (10/20 0719) BP: (107-118)/(52-78) 107/52 (10/20 0719) SpO2:  [97 %-98 %] 98 % (10/20 0719)  Physical Exam:  General: alert and cooperative Lochia: appropriate Uterine Fundus: firm Incision: none DVT Evaluation: No evidence of DVT seen on physical exam.   Recent Labs  06/08/17 0732 06/09/17 0545  HGB 12.1 11.3*  HCT 35.6 32.8*    Assessment/Plan: Infant doing well  BTL today, d/c later The patient has been fully informed about all methods of contraception, both temporary and permanent. She understands that tubal ligation is meant to be permanent, absolute and irreversible. She was told that there is an approximately 1 in 400 chance of a pregnancy in the future after tubal ligation. She was told the short and long term complications of tubal ligation. She understands the risks from this surgery include, but are not limited to, the risks of anesthesia, hemorrhage, infection, perforation, and injury to adjacent structures, bowel, bladder and blood vessels.    LOS: 2 days   Nat[facility]iddle [facility] 06/10/2017, 8:01 AM

## 2017-06-10 NOTE — Discharge Instructions (Signed)

## 2017-06-10 NOTE — Transfer of Care (Signed)
Immediate Anesthesia Transfer of Care Note  Patient: Natasha LemonsBrooke Westberg  Procedure(s) Performed: POST PARTUM TUBAL LIGATION (N/A )  Patient Location: PACU  Anesthesia Type:General  Level of Consciousness: sedated  Airway & Oxygen Therapy: Patient Spontanous Breathing and Patient connected to face mask oxygen  Post-op Assessment: Report given to RN and Post -op Vital signs reviewed and stable  Post vital signs: Reviewed and stable  Last Vitals:  Vitals:   06/09/17 1917 06/10/17 0719  BP: 118/78 (!) 107/52  Pulse: 91 82  Resp: 18 20  Temp: 36.8 C 36.8 C  SpO2: 97% 98%    Last Pain:  Vitals:   06/10/17 0735  TempSrc:   PainSc: 5       Patients Stated Pain Goal: 0 (06/09/17 1441)  Complications: No apparent anesthesia complications

## 2017-06-10 NOTE — Op Note (Signed)
Operative Note  06/10/2017  PRE-OP DIAGNOSIS: Desire for sterility  POST-OP DIAGNOSIS: same  SURGEON: Annamarie MajorPaul Jaquise Faux, MD, FACOG  PROCEDURE: Postpartum Bilateral Tubal Ligation Procedure   ANESTHESIA: Choice   ESTIMATED BLOOD LOSS: Min  SPECIMENS: Portion of left and right tube  COMPLICATIONS: none  DISPOSITION: PACU - hemodynamically stable.  CONDITION: stable  FINDINGS: Exam under anesthesia revealed mobile  uterus with no masses and bilateral adnexa without masses or fullness.   TECHNIQUE:  Patient is prepped and draped in usual sterile fashion after adequate anesthesia is obtained in the supine position on the operating room table.  Local anesthesia is injected into the skin just inferior to the umbilicus, followed by a small elliptical incision with a scapel.  Fascia is identified and tented upwards, and an incision is made with Mayo scissors.  Identification of no adherent bowel is made. Retractors are placed and trendelenburg positioning is achieved.    The left Fallopian tube was identified, grasped with the Babcock clamps, lifted to the skin incision and followed out distally to the fimbriae. An avascular midsection of the tube approximately 3-4cm from the cornua was grasped with the babcock clamps and brought into a knuckle at the skin incision. The tube was double ligated with 2-0 Vicryl suture and the intervening portion of tube was transected and removed. Excellent hemostasis was noted and the tube was returned to the abdomen. Attention was then turned to the right fallopian tube after confirmation of identification by tracing the tube out to the fimbriae. The same procedure was then performed on the right Fallopian tube. Again, excellent hemostasis was noted at the end of the procedure.  Retractors are removed and fascia closed with a 2-0 Vicryl suture. Irrigation and hemostasis confirmed.  Skin closed with a 4-0 vicryl suture in a subcuticular fashion followed by skin  adhesive.  Pt goes to recovery room in stable fashion.  All counts correct times 2.  Annamarie MajorPaul Deloros Beretta, MD, Merlinda FrederickFACOG [facility], [facility] 06/10/2017  8:42 AM

## 2017-06-10 NOTE — Anesthesia Procedure Notes (Signed)
Procedure Name: Intubation Date/Time: 06/10/2017 8:17 AM Performed by: Nelda Marseille Pre-anesthesia Checklist: Patient identified, Patient being monitored, Timeout performed, Emergency Drugs available and Suction available Patient Re-evaluated:Patient Re-evaluated prior to induction Oxygen Delivery Method: Circle system utilized Preoxygenation: Pre-oxygenation with 100% oxygen Induction Type: IV induction Ventilation: Mask ventilation without difficulty Laryngoscope Size: Mac and 3 Grade View: Grade II Tube type: Oral Tube size: 7.0 mm Number of attempts: 1 Airway Equipment and Method: Stylet Placement Confirmation: ETT inserted through vocal cords under direct vision,  positive ETCO2 and breath sounds checked- equal and bilateral Secured at: 21 cm Tube secured with: Tape Dental Injury: Teeth and Oropharynx as per pre-operative assessment

## 2017-06-10 NOTE — Anesthesia Postprocedure Evaluation (Signed)
Anesthesia Post Note  Patient: Natasha Riddle  Procedure(s) Performed: POST PARTUM TUBAL LIGATION (N/A )  Patient location during evaluation: PACU Anesthesia Type: General Level of consciousness: awake and alert Pain management: pain level controlled Vital Signs Assessment: post-procedure vital signs reviewed and stable Respiratory status: spontaneous breathing, nonlabored ventilation, respiratory function stable and patient connected to nasal cannula oxygen Cardiovascular status: blood pressure returned to baseline and stable Postop Assessment: no apparent nausea or vomiting Anesthetic complications: no     Last Vitals:  Vitals:   06/10/17 0946 06/10/17 0949  BP:  113/71  Pulse: 79 81  Resp: 14 16  Temp:    SpO2: 95% 95%    Last Pain:  Vitals:   06/10/17 0949  TempSrc:   PainSc: 4                  Declyn Delsol S

## 2017-06-10 NOTE — Anesthesia Preprocedure Evaluation (Signed)
Anesthesia Evaluation  Patient identified by MRN, date of birth, ID band Patient awake    Reviewed: Allergy & Precautions, NPO status , Patient's Chart, lab work & pertinent test results, reviewed documented beta blocker date and time   Airway Mallampati: III  TM Distance: >3 FB     Dental  (+) Chipped   Pulmonary Current Smoker,           Cardiovascular      Neuro/Psych PSYCHIATRIC DISORDERS Depression    GI/Hepatic   Endo/Other    Renal/GU      Musculoskeletal   Abdominal   Peds  Hematology   Anesthesia Other Findings Obese.  Reproductive/Obstetrics                             Anesthesia Physical Anesthesia Plan  ASA: II  Anesthesia Plan: General   Post-op Pain Management:    Induction: Intravenous  PONV Risk Score and Plan:   Airway Management Planned: Oral ETT  Additional Equipment:   Intra-op Plan:   Post-operative Plan:   Informed Consent: I have reviewed the patients History and Physical, chart, labs and discussed the procedure including the risks, benefits and alternatives for the proposed anesthesia with the patient or authorized representative who has indicated his/her understanding and acceptance.     Plan Discussed with: CRNA  Anesthesia Plan Comments:         Anesthesia Quick Evaluation

## 2017-06-10 NOTE — Progress Notes (Addendum)
D/C instructions provided, pt states understanding, aware of follow up appt.  Prescription given to pt.  D/C home to car via nurse in wheelchair.

## 2017-06-10 NOTE — Anesthesia Post-op Follow-up Note (Signed)
Anesthesia QCDR form completed.        

## 2017-06-11 ENCOUNTER — Encounter: Payer: Self-pay | Admitting: Obstetrics & Gynecology

## 2017-06-13 LAB — SURGICAL PATHOLOGY

## 2019-01-06 ENCOUNTER — Emergency Department
Admission: EM | Admit: 2019-01-06 | Discharge: 2019-01-07 | Disposition: A | Discharge status: Other Acute Inpatient Hospital | Payer: Medicaid Other | Attending: Emergency Medicine | Admitting: Emergency Medicine

## 2019-01-06 ENCOUNTER — Other Ambulatory Visit: Payer: Self-pay

## 2019-01-06 DIAGNOSIS — G479 Sleep disorder, unspecified: Secondary | ICD-10-CM | POA: Insufficient documentation

## 2019-01-06 DIAGNOSIS — F17210 Nicotine dependence, cigarettes, uncomplicated: Secondary | ICD-10-CM | POA: Insufficient documentation

## 2019-01-06 DIAGNOSIS — R45851 Suicidal ideations: Secondary | ICD-10-CM | POA: Insufficient documentation

## 2019-01-06 DIAGNOSIS — Z1159 Encounter for screening for other viral diseases: Secondary | ICD-10-CM | POA: Insufficient documentation

## 2019-01-06 DIAGNOSIS — T1491XA Suicide attempt, initial encounter: Secondary | ICD-10-CM

## 2019-01-06 DIAGNOSIS — F1510 Other stimulant abuse, uncomplicated: Secondary | ICD-10-CM | POA: Diagnosis not present

## 2019-01-06 DIAGNOSIS — Z79899 Other long term (current) drug therapy: Secondary | ICD-10-CM | POA: Diagnosis not present

## 2019-01-06 DIAGNOSIS — F309 Manic episode, unspecified: Secondary | ICD-10-CM

## 2019-01-06 DIAGNOSIS — F329 Major depressive disorder, single episode, unspecified: Secondary | ICD-10-CM | POA: Diagnosis present

## 2019-01-06 LAB — CBC
HCT: 35.7 % — ABNORMAL LOW (ref 36.0–46.0)
Hemoglobin: 11.9 g/dL — ABNORMAL LOW (ref 12.0–15.0)
MCH: 26.3 pg (ref 26.0–34.0)
MCHC: 33.3 g/dL (ref 30.0–36.0)
MCV: 79 fL — ABNORMAL LOW (ref 80.0–100.0)
Platelets: 328 10*3/uL (ref 150–400)
RBC: 4.52 MIL/uL (ref 3.87–5.11)
RDW: 15.9 % — ABNORMAL HIGH (ref 11.5–15.5)
WBC: 10.7 10*3/uL — ABNORMAL HIGH (ref 4.0–10.5)
nRBC: 0 % (ref 0.0–0.2)

## 2019-01-06 LAB — COMPREHENSIVE METABOLIC PANEL
ALT: 11 U/L (ref 0–44)
AST: 14 U/L — ABNORMAL LOW (ref 15–41)
Albumin: 4.8 g/dL (ref 3.5–5.0)
Alkaline Phosphatase: 56 U/L (ref 38–126)
Anion gap: 11 (ref 5–15)
BUN: 19 mg/dL (ref 6–20)
CO2: 22 mmol/L (ref 22–32)
Calcium: 9.6 mg/dL (ref 8.9–10.3)
Chloride: 107 mmol/L (ref 98–111)
Creatinine, Ser: 0.63 mg/dL (ref 0.44–1.00)
GFR calc Af Amer: >60 mL/min (ref >60)
GFR calc non Af Amer: >60 mL/min (ref >60)
Glucose, Bld: 98 mg/dL (ref 70–99)
Potassium: 3.7 mmol/L (ref 3.5–5.1)
Sodium: 140 mmol/L (ref 135–145)
Total Bilirubin: 0.7 mg/dL (ref 0.3–1.2)
Total Protein: 8.3 g/dL — ABNORMAL HIGH (ref 6.5–8.1)

## 2019-01-06 LAB — SARS CORONAVIRUS 2 BY RT PCR (HOSPITAL ORDER, PERFORMED IN ~~LOC~~ HOSPITAL LAB): SARS Coronavirus 2: NEGATIVE

## 2019-01-06 LAB — URINE DRUG SCREEN, QUALITATIVE (ARMC ONLY)
Amphetamines, Ur Screen: POSITIVE — AB
Barbiturates, Ur Screen: NOT DETECTED
Benzodiazepine, Ur Scrn: POSITIVE — AB
Cannabinoid 50 Ng, Ur ~~LOC~~: NOT DETECTED
Cocaine Metabolite,Ur ~~LOC~~: NOT DETECTED
MDMA (Ecstasy)Ur Screen: NOT DETECTED
Methadone Scn, Ur: NOT DETECTED
Opiate, Ur Screen: NOT DETECTED
Phencyclidine (PCP) Ur S: NOT DETECTED
Tricyclic, Ur Screen: NOT DETECTED

## 2019-01-06 LAB — ACETAMINOPHEN LEVEL: Acetaminophen (Tylenol), Serum: <10 ug/mL — ABNORMAL LOW (ref 10–30)

## 2019-01-06 LAB — ETHANOL: Alcohol, Ethyl (B): <10 mg/dL (ref <10)

## 2019-01-06 LAB — SALICYLATE LEVEL: Salicylate Lvl: <7 mg/dL (ref 2.8–30.0)

## 2019-01-06 LAB — PREGNANCY, URINE: Preg Test, Ur: NEGATIVE

## 2019-01-06 MED ORDER — LORAZEPAM 2 MG PO TABS
2.0000 mg | ORAL_TABLET | Freq: Once | ORAL | Status: AC
  Administered 2019-01-07: 2 mg via ORAL
  Filled 2019-01-06: qty 1

## 2019-01-06 MED ORDER — OLANZAPINE 5 MG PO TABS: 5.0000 mg | ORAL_TABLET | Freq: Every morning | ORAL | Status: DC

## 2019-01-06 MED ORDER — OLANZAPINE 5 MG PO TABS
5.0000 mg | ORAL_TABLET | Freq: Every day | ORAL | Status: DC
  Administered 2019-01-07: 5 mg via ORAL
  Filled 2019-01-06: qty 1

## 2019-01-06 MED ORDER — EYE WASH OPHTH SOLN
2.0000 [drp] | OPHTHALMIC | Status: DC | PRN
Start: 2019-01-06 — End: 2019-01-07
  Administered 2019-01-06: 2 [drp] via OPHTHALMIC
  Filled 2019-01-06 (×2): qty 118

## 2019-01-06 MED ORDER — HYDROCODONE-ACETAMINOPHEN 5-325 MG PO TABS
1.0000 | ORAL_TABLET | Freq: Once | ORAL | Status: AC
  Administered 2019-01-06: 1 via ORAL
  Filled 2019-01-06: qty 1

## 2019-01-06 MED ORDER — LORAZEPAM 2 MG PO TABS: 2.0000 mg | ORAL_TABLET | Freq: Four times a day (QID) | ORAL | Status: DC | PRN

## 2019-01-06 NOTE — ED Triage Notes (Signed)
Pt was brought into the ED by husband and mother, states she has been very depressed for several months, states today she was going to try and hang herself. States she has been using meth recently to "numb it". Pt is tearful in triage.

## 2019-01-06 NOTE — ED Notes (Signed)
Gloves removed from patient's room due to excessive use

## 2019-01-06 NOTE — ED Notes (Signed)
Pt found climbing on toilet to change TV channel. Pt asked to step down. RN changed channel with TV remote and remote returned to ED secretary.

## 2019-01-06 NOTE — BH Assessment (Addendum)
Assessment Note  Natasha Riddle is an 33 y.o. female.  The pt came in due to having suicidal thoughts of wanting to hang herself.  She stated, "I really want to die".  She stated she is stressed, because she doesn't have her children.  The pt has 3 children.  Her middle child is with their father in [place].  The pt is also stressed about financial problems.  She is living in a one bedroom home and she sent her 47 year old son with her parents.  The pt found out her husband is cheating on her and they are currently separated.  The pt stated her husband plans to gain custody of their youngest child.  She stated she started using crystal meth 2 weeks ago and last used an hour before coming to the hospital.  The pt stated her last attempt was when she was a teenager.  She was last hospitalized in 2012 at a hospital in [place].  She denies any currently mental health treatment currently.   She lives with her husband and youngest child.  She has a job, but has not been going to the job due to her depression.  She denies self harm, HI, and hallucinations.  She stated she is in a felony second chance program, because she pawned jewelry her husband stole from his sister.  The pt reports not sleeping or eating well.  She reports feelind depressed, crying spells, isolating, and having trouble getting out of bed.  She reports being more anxious and has been pulling her hair and biting her nails.  The pt denies any other SA.  Her UDS is positive for amphetamines and benzodiazepines.  Pt is dressed in scrubs. She is alert and oriented x4. Pt speaks in a clear tone, at moderate volume and normal pace. Eye contact is good. Pt's mood is depressed. Thought process is coherent and relevant. There is no indication Pt is currently responding to internal stimuli or experiencing delusional thought content.?Pt was cooperative throughout assessment.        Diagnosis: F33.2 Major depressive disorder, Recurrent episode,  Severe  F15.20 Amphetamine-type substance use disorder, Moderate  Past Medical History:  Past Medical History:  Diagnosis Date  . Depression     Past Surgical History:  Procedure Laterality Date  . TUBAL LIGATION N/A 06/10/2017   Procedure: POST PARTUM TUBAL LIGATION;  Surgeon: Nadara Mustard, MD;  Location: [facility] ORS;  Service: Gynecology;  Laterality: N/A;    Family History: No family history on file.  Social History:  reports that she has been smoking cigarettes. She has been smoking about 0.50 packs per day. She has never used smokeless tobacco. She reports current drug use. Drug: Methamphetamines. She reports that she does not drink alcohol.  Additional Social History:  Alcohol / Drug Use Pain Medications: See MAR Prescriptions: See MAR Over the Counter: See MAR History of alcohol / drug use?: Yes Longest period of sobriety (when/how long): NA Substance #1 Name of Substance 1: Crystal meth 1 - Age of First Use: 33 1 - Amount (size/oz): unknown 1 - Frequency: started 2 weeks ago 1 - Duration: 2 weeks 1 - Last Use / Amount: 01/06/2019  CIWA: CIWA-Ar BP: (!) 127/91 Pulse Rate: (!) 125 COWS:    Allergies: No Known Allergies  Home Medications: (Not in a hospital admission)   OB/GYN Status:  Patient's last menstrual period was 12/23/2018 (approximate).  General Assessment Data Location of Assessment: [facility] ED TTS Assessment: In system Is this  a Tele or Face-to-Face Assessment?: Face-to-Face Is this an Initial Assessment or a Re-assessment for this encounter?: Initial Assessment Patient Accompanied by:: N/A Language Other than English: No Living Arrangements: Other (Comment)(home) What gender do you identify as?: Female Marital status: Married Pregnancy Status: No Living Arrangements: Spouse/significant other, Children Can pt return to current living arrangement?: Yes Admission Status: Voluntary Is patient capable of signing voluntary admission?:  Yes Referral Source: Self/Family/Friend Insurance type: Medicaid     Crisis Care Plan Living Arrangements: Spouse/significant other, Children Legal Guardian: Other:(Self) Name of Psychiatrist: none Name of Therapist: none  Education Status Is patient currently in school?: No Is the patient employed, unemployed or receiving disability?: Employed  Risk to self with the past 6 months Suicidal Ideation: Yes-Currently Present Has patient been a risk to self within the past 6 months prior to admission? : Yes Suicidal Intent: Yes-Currently Present Has patient had any suicidal intent within the past 6 months prior to admission? : Yes Is patient at risk for suicide?: Yes Suicidal Plan?: Yes-Currently Present Has patient had any suicidal plan within the past 6 months prior to admission? : Yes Specify Current Suicidal Plan: hang self Access to Means: Yes Specify Access to Suicidal Means: can get something to hang self What has been your use of drugs/alcohol within the last 12 months?: crystal meth use Previous Attempts/Gestures: Yes How many times?: 1 Other Self Harm Risks: none Triggers for Past Attempts: None known Intentional Self Injurious Behavior: None Family Suicide History: No Recent stressful life event(s): Conflict (Comment), Financial Problems(problems with husband) Persecutory voices/beliefs?: No Depression: Yes Depression Symptoms: Despondent, Insomnia, Tearfulness, Isolating, Loss of interest in usual pleasures, Feeling worthless/self pity Substance abuse history and/or treatment for substance abuse?: Yes Suicide prevention information given to non-admitted patients: Not applicable  Risk to Others within the past 6 months Homicidal Ideation: No Does patient have any lifetime risk of violence toward others beyond the six months prior to admission? : No Thoughts of Harm to Others: No Current Homicidal Intent: No Current Homicidal Plan: No Access to Homicidal Means:  No Identified Victim: pt denies History of harm to others?: No Assessment of Violence: None Noted Violent Behavior Description: none Does patient have access to weapons?: No Criminal Charges Pending?: No Does patient have a court date: No Is patient on probation?: No  Psychosis Hallucinations: None noted Delusions: None noted  Mental Status Report Appearance/Hygiene: Unremarkable, In scrubs Eye Contact: Good Motor Activity: Freedom of movement, Unremarkable Speech: Logical/coherent Level of Consciousness: Alert Mood: Depressed Affect: Depressed Anxiety Level: None Thought Processes: Coherent, Relevant Judgement: Impaired Orientation: Person, Place, Time, Situation Obsessive Compulsive Thoughts/Behaviors: None  Cognitive Functioning Concentration: Normal Memory: Recent Intact, Remote Intact Is patient IDD: No Insight: Poor Impulse Control: Poor Appetite: Poor Have you had any weight changes? : No Change Sleep: Decreased Total Hours of Sleep: 4 Vegetative Symptoms: None  ADLScreening [facility] Assessment Services) Patient's cognitive ability adequate to safely complete daily activities?: Yes Patient able to express need for assistance with ADLs?: Yes Independently performs ADLs?: Yes (appropriate for developmental age)  Prior Inpatient Therapy Prior Inpatient Therapy: Yes Prior Therapy Dates: 2012 Prior Therapy Facilty/Provider(s): Hospital in [place] Reason for Treatment: depression  Prior Outpatient Therapy Prior Outpatient Therapy: No Does patient have an ACCT team?: No Does patient have Intensive In-House Services?  : No Does patient have [facility] services? : No Does patient have P4CC services?: No  ADL Screening (condition at time of admission) Patient's cognitive ability adequate to safely complete daily activities?:  Yes Patient able to express need for assistance with ADLs?: Yes Independently performs ADLs?: Yes (appropriate for developmental age)        Abuse/Neglect Assessment (Assessment to be complete while patient is alone) Abuse/Neglect Assessment Can Be Completed: Yes Physical Abuse: Denies Verbal Abuse: Yes, present (Comment) Sexual Abuse: Denies Exploitation of patient/patient's resources: Denies Self-Neglect: Denies Values / Beliefs Cultural Requests During Hospitalization: None Spiritual Requests During Hospitalization: None Consults Spiritual Care Consult Needed: No Social Work Consult Needed: No Merchant navy officerAdvance Directives (For Healthcare) Does Patient Have a Medical Advance Directive?: No Would patient like information on creating a medical advance directive?: No - Patient declined          Disposition:  Disposition Initial Assessment Completed for this Encounter: Yes  On Site Evaluation by:   Reviewed with Physician:    Ottis StainGarvin, Errin Whitelaw Jermaine 01/06/2019 6:01 PM

## 2019-01-06 NOTE — ED Provider Notes (Signed)
[facility] Emergency Department Provider Note  ____________________________________________  Time seen: Approximately 8:29 PM  I have reviewed the triage vital signs and the nursing notes.   HISTORY  Chief Complaint Suicidal    HPI Natasha Riddle is a 33 y.o. female with a history of depression who comes to the ED complaining of feeling sad, depressed, hopeless, difficulty sleeping, loss of appetite, anhedonia worsening for the past year.  In the last 2 weeks she is been abusing methamphetamine as well.  Today after using methamphetamine, she plan to hang herself but her husband talked her out of it and she came to the ED.  Symptoms are constant, waxing and waning, overall worsening.  No recent acute stressors that the patient can identify.  She does note that she has been on a downward trajectory over the past 3 years since moving to [place], her husband being unfaithful, being evicted from their apartment and having to send her kids away to live with other family members.      Past Medical History:  Diagnosis Date  . Depression      Patient Active Problem List   Diagnosis Date Noted  . Admission for sterilization 06/10/2017  . Post term pregnancy 06/08/2017  . Abnormal fetal heart rate 05/17/2017  . Labor and delivery, indication for care 05/15/2017  . Indication for care in labor and delivery, antepartum 04/26/2017     Past Surgical History:  Procedure Laterality Date  . TUBAL LIGATION N/A 06/10/2017   Procedure: POST PARTUM TUBAL LIGATION;  Surgeon: Nadara Mustard, MD;  Location: [facility] ORS;  Service: Gynecology;  Laterality: N/A;     Prior to Admission medications   Medication Sig Start Date End Date Taking? Authorizing Provider  clonazePAM (KLONOPIN) 1 MG tablet Take 1 mg by mouth at bedtime as needed for anxiety (sleep).   Yes [provider]  gabapentin (NEURONTIN) 400 MG capsule Take 400 mg by mouth 3 (three) times daily as  needed (nerve pain).   Yes [provider]  HYDROcodone-acetaminophen (NORCO/VICODIN) 5-325 MG tablet Take 1 tablet by mouth 2 (two) times daily as needed for moderate pain or severe pain.   Yes [provider]  QUEtiapine (SEROQUEL) 200 MG tablet Take 200 mg by mouth at bedtime.   Yes [provider]     Allergies Patient has no known allergies.   No family history on file.  Social History Social History   Tobacco Use  . Smoking status: Current Every Day Smoker    Packs/day: 0.50    Types: Cigarettes  . Smokeless tobacco: Never Used  Substance Use Topics  . Alcohol use: No  . Drug use: Yes    Types: Methamphetamines    Review of Systems  Constitutional:   No fever or chills.  ENT:   No sore throat. No rhinorrhea. Cardiovascular:   No chest pain or syncope. Respiratory:   No dyspnea or cough. Gastrointestinal:   Negative for abdominal pain, vomiting and diarrhea.  Musculoskeletal:   Negative for focal pain or swelling All other systems reviewed and are negative except as documented above in ROS and HPI.  ____________________________________________   PHYSICAL EXAM:  VITAL SIGNS: ED Triage Vitals  Enc Vitals Group     BP 01/06/19 1448 (!) 127/91     Pulse Rate 01/06/19 1448 (!) 125     Resp 01/06/19 1448 17     Temp 01/06/19 1448 98.5 F (36.9 C)     Temp Source 01/06/19  1448 Oral     SpO2 01/06/19 1448 98 %     Weight 01/06/19 1450 170 lb (77.1 kg)     Height 01/06/19 1450 5\' 3"  (1.6 m)     Head Circumference --      Peak Flow --      Pain Score 01/06/19 1450 7     Pain Loc --      Pain Edu? --      Excl. in GC? --     Vital signs reviewed, nursing assessments reviewed.   Constitutional:   Alert and oriented. Non-toxic appearance. Eyes:   Conjunctivae are normal. EOMI. PERRL. ENT      Head:   Normocephalic and atraumatic.      Nose:   No congestion/rhinnorhea.       Mouth/Throat:   MMM, no pharyngeal erythema. No  peritonsillar mass.       Neck:   No meningismus. Full ROM. Hematological/Lymphatic/Immunilogical:   No cervical lymphadenopathy. Cardiovascular:   RRR. Symmetric bilateral radial and DP pulses.  No murmurs. Cap refill less than 2 seconds. Respiratory:   Normal respiratory effort without tachypnea/retractions. Breath sounds are clear and equal bilaterally. No wheezes/rales/rhonchi. Gastrointestinal:   Soft and nontender. Non distended. There is no CVA tenderness.  No rebound, rigidity, or guarding. Genitourinary:   deferred Musculoskeletal:   Normal range of motion in all extremities. No joint effusions.  No lower extremity tenderness.  No edema. Neurologic:   Normal speech and language.  Motor grossly intact. No acute focal neurologic deficits are appreciated.  Skin:    Skin is warm, dry and intact. No rash noted.  No petechiae, purpura, or bullae.  ____________________________________________    LABS (pertinent positives/negatives) (all labs ordered are listed, but only abnormal results are displayed) Labs Reviewed  COMPREHENSIVE METABOLIC PANEL - Abnormal; Notable for the following components:      Result Value   Total Protein 8.3 (*)    AST 14 (*)    All other components within normal limits  ACETAMINOPHEN LEVEL - Abnormal; Notable for the following components:   Acetaminophen (Tylenol), Serum <10 (*)    All other components within normal limits  CBC - Abnormal; Notable for the following components:   WBC 10.7 (*)    Hemoglobin 11.9 (*)    HCT 35.7 (*)    MCV 79.0 (*)    RDW 15.9 (*)    All other components within normal limits  URINE DRUG SCREEN, QUALITATIVE ([facility] ONLY) - Abnormal; Notable for the following components:   Amphetamines, Ur Screen POSITIVE (*)    Benzodiazepine, Ur Scrn POSITIVE (*)    All other components within normal limits  SARS CORONAVIRUS 2 (HOSPITAL ORDER, PERFORMED IN [facility])  ETHANOL  SALICYLATE LEVEL  PREGNANCY, URINE    ____________________________________________   EKG    ____________________________________________    RADIOLOGY  No results found.  ____________________________________________   PROCEDURES Procedures  ____________________________________________    CLINICAL IMPRESSION / ASSESSMENT AND PLAN / ED COURSE  Medications ordered in the ED: Medications  eye wash ((SODIUM/POTASSIUM/SOD CHLORIDE)) ophthalmic solution 2 drop (2 drops Both Eyes Given 01/06/19 1908)  OLANZapine (ZYPREXA) tablet 5 mg (has no administration in time range)  OLANZapine (ZYPREXA) tablet 5 mg (has no administration in time range)  LORazepam (ATIVAN) tablet 2 mg (has no administration in time range)  LORazepam (ATIVAN) tablet 2 mg (has no administration in time range)  HYDROcodone-acetaminophen (NORCO/VICODIN) 5-325 MG per tablet 1 tablet (1 tablet Oral Given  01/06/19 1614)    Pertinent labs & imaging results that were available during my care of the patient were reviewed by me and considered in my medical decision making (see chart for details).  Natasha Riddle was evaluated in Emergency Department on 01/06/2019 for the symptoms described in the history of present illness. She was evaluated in the context of the global COVID-19 pandemic, which necessitated consideration that the patient might be at risk for infection with the SARS-CoV-2 virus that causes COVID-19. Institutional protocols and algorithms that pertain to the evaluation of patients at risk for COVID-19 are in a state of rapid change based on information released by regulatory bodies including the CDC and federal and state organizations. These policies and algorithms were followed during the patient's care in the ED.     Clinical Course as of Jan 06 2028  Sun Jan 06, 2019  1607 Pt p/w sx of MDD, planned to hang herself but attempt was interrupted by spouse. Will IVC, f/u psych recs. Will give home dose of norco for chronic pain   [PS]     Clinical Course User Index [PS] Sharman CheekStafford, Amoria Mclees, MD     ----------------------------------------- 8:32 PM on 01/06/2019 -----------------------------------------  Psychiatry consult note reviewed, finds the patient to be experiencing a manic episode in addition to her methamphetamine use.  Recommends psychiatric hospitalization for further stabilization.  Zyprexa and Ativan ordered as recommended.  Continue IVC.  ____________________________________________   FINAL CLINICAL IMPRESSION(S) / ED DIAGNOSES    Final diagnoses:  Manic episode (HCC)  Methamphetamine use (HCC)  Suicide attempt [facility](HCC)     ED Discharge Orders    None      Portions of this note were generated with dragon dictation software. Dictation errors may occur despite best attempts at proofreading.   Sharman CheekStafford, Rosalene Wardrop, MD 01/06/19 2034

## 2019-01-06 NOTE — ED Notes (Signed)
Pt with c/o of dry eyes. Eye wash given to pt for use with verbal order from MD. No barcode available to scan.

## 2019-01-06 NOTE — ED Notes (Addendum)
Pt changed into behavorial clothing by this Clinical research associate and EDT Dorian the following belongings were obtained......Marland Kitchen   1 Black t-shirt, 1 Black yoga pants,  1 pair of black flip flops, 1 grey colored necklace with heart locket, 1 grey colored ring with clear stone in it, 1 pair clear stone ear rings, 1 mint green colored bra, 1 pair of pink colored underwear,

## 2019-01-06 NOTE — ED Notes (Signed)
While conducting home medication interview, patient reports severe insomnia if she does not take her Seroquel as well as taking more than prescribed dose of clonazepam to get to sleep. Patient also reports having tried multiple antidepressants over the years with suboptimal results. Described becoming quickly tolerant to antidepressants so that she feels they aren't working. Described losing hope of finding reason for living. Says she hurts all the time due to Lupus.  ** The above is intended solely for informational and/or communicative purposes. It should in no way be considered an endorsement of any specific treatment, therapy or action. **

## 2019-01-06 NOTE — ED Notes (Signed)
Report given to [facility] MD [place]. Camera placed in room and process explained to patient who is understanding.

## 2019-01-07 ENCOUNTER — Inpatient Hospital Stay
Admission: AD | Admit: 2019-01-07 | Discharge: 2019-01-11 | DRG: 885 | Disposition: A | Discharge status: Home / Self Care | Payer: Medicaid Other | Source: Intra-hospital | Attending: Psychiatry | Admitting: Psychiatry

## 2019-01-07 DIAGNOSIS — F17210 Nicotine dependence, cigarettes, uncomplicated: Secondary | ICD-10-CM | POA: Diagnosis present

## 2019-01-07 DIAGNOSIS — Z1159 Encounter for screening for other viral diseases: Secondary | ICD-10-CM | POA: Diagnosis not present

## 2019-01-07 DIAGNOSIS — F309 Manic episode, unspecified: Secondary | ICD-10-CM | POA: Diagnosis not present

## 2019-01-07 DIAGNOSIS — G4700 Insomnia, unspecified: Secondary | ICD-10-CM | POA: Diagnosis present

## 2019-01-07 DIAGNOSIS — F3130 Bipolar disorder, current episode depressed, mild or moderate severity, unspecified: Secondary | ICD-10-CM | POA: Diagnosis present

## 2019-01-07 DIAGNOSIS — R45851 Suicidal ideations: Secondary | ICD-10-CM | POA: Diagnosis present

## 2019-01-07 DIAGNOSIS — F1520 Other stimulant dependence, uncomplicated: Secondary | ICD-10-CM | POA: Diagnosis present

## 2019-01-07 DIAGNOSIS — M797 Fibromyalgia: Secondary | ICD-10-CM | POA: Diagnosis present

## 2019-01-07 DIAGNOSIS — Z9119 Patient's noncompliance with other medical treatment and regimen: Secondary | ICD-10-CM | POA: Diagnosis not present

## 2019-01-07 HISTORY — DX: Bipolar disorder, current episode depressed, mild or moderate severity, unspecified: F31.30

## 2019-01-07 HISTORY — DX: Other stimulant dependence, uncomplicated: F15.20

## 2019-01-07 LAB — TSH: TSH: 0.946 u[IU]/mL (ref 0.350–4.500)

## 2019-01-07 MED ORDER — GABAPENTIN 400 MG PO CAPS
400.0000 mg | ORAL_CAPSULE | Freq: Three times a day (TID) | ORAL | Status: DC
  Administered 2019-01-07 – 2019-01-08 (×4): 400 mg via ORAL
  Filled 2019-01-07 (×4): qty 1

## 2019-01-07 MED ORDER — EYE WASH OPHTH SOLN
2.0000 [drp] | OPHTHALMIC | Status: DC | PRN
  Filled 2019-01-07: qty 118

## 2019-01-07 MED ORDER — TRAZODONE HCL 100 MG PO TABS
100.0000 mg | ORAL_TABLET | Freq: Every evening | ORAL | Status: DC | PRN
  Administered 2019-01-07 – 2019-01-10 (×4): 100 mg via ORAL
  Filled 2019-01-07 (×4): qty 1

## 2019-01-07 MED ORDER — OLANZAPINE 5 MG PO TABS
5.0000 mg | ORAL_TABLET | Freq: Every day | ORAL | Status: DC
  Administered 2019-01-07: 5 mg via ORAL
  Filled 2019-01-07: qty 1

## 2019-01-07 MED ORDER — LITHIUM CARBONATE 300 MG PO CAPS
600.0000 mg | ORAL_CAPSULE | Freq: Every day | ORAL | Status: DC
  Administered 2019-01-07 – 2019-01-10 (×4): 600 mg via ORAL
  Filled 2019-01-07 (×4): qty 2

## 2019-01-07 MED ORDER — OLANZAPINE 5 MG PO TABS
5.0000 mg | ORAL_TABLET | Freq: Every morning | ORAL | Status: DC
  Administered 2019-01-07 – 2019-01-08 (×2): 5 mg via ORAL
  Filled 2019-01-07 (×2): qty 1

## 2019-01-07 MED ORDER — ALUM & MAG HYDROXIDE-SIMETH 200-200-20 MG/5ML PO SUSP: 30.0000 mL | ORAL | Status: DC | PRN

## 2019-01-07 MED ORDER — MAGNESIUM HYDROXIDE 400 MG/5ML PO SUSP: 30.0000 mL | Freq: Every day | ORAL | Status: DC | PRN

## 2019-01-07 MED ORDER — HYDROXYZINE HCL 50 MG PO TABS
50.0000 mg | ORAL_TABLET | Freq: Four times a day (QID) | ORAL | Status: DC | PRN
  Administered 2019-01-07 – 2019-01-11 (×5): 50 mg via ORAL
  Filled 2019-01-07 (×6): qty 1

## 2019-01-07 MED ORDER — ACETAMINOPHEN 325 MG PO TABS
650.0000 mg | ORAL_TABLET | Freq: Four times a day (QID) | ORAL | Status: DC | PRN
  Administered 2019-01-08 – 2019-01-10 (×4): 650 mg via ORAL
  Filled 2019-01-07 (×4): qty 2

## 2019-01-07 NOTE — Progress Notes (Signed)
Patient is a new admit from ED evaluated and tested for COBID 19 (-) patient is involuntary committed by her husband, for severe depressions,  anxiety and hopelessness, and wishing to die, patient expressed smoking meth, about two weeks ago as her depression started getting worse , and patient stated planning to hanging her self, until her husband talked her out of it, patient stated that she is not feeling that way now that is the hospital, patient contract for safety, body search and skin check is done by two staff and no contraband found and skin is clean with multiple tattoos at back ans chest area. Unit safety guide line and expected behaviors are discussed, room and unit orientation is complete, hygiene products are provided, cold beverages and fluids are provided, patient denies any SI/HI/AVH at this time, no signs of delusions noted, mood and affect is appropriate considering the circumstances , patient voice no other concerns. Patient was made known of cameras in the hall ways and 15 minutes safety rounding for safety noted.

## 2019-01-07 NOTE — Tx Team (Signed)
Initial Treatment Plan 01/07/2019 2:21 AM [name] [id number]    PATIENT STRESSORS: Financial difficulties Health problems Marital or family conflict Substance abuse   PATIENT STRENGTHS: Average or above average intelligence Capable of independent living Communication skills Motivation for treatment/growth Supportive family/friends   PATIENT IDENTIFIED PROBLEMS: Substance abuse     Depression/Anxiety    Suicide Ideations             DISCHARGE CRITERIA:  Adequate post-discharge living arrangements Medical problems require only outpatient monitoring Need for constant or close observation no longer present Safe-care adequate arrangements made  PRELIMINARY DISCHARGE PLAN: Attend 12-step recovery group Outpatient therapy Participate in family therapy Return to previous living arrangement  PATIENT/FAMILY INVOLVEMENT: This treatment plan has been presented to and reviewed with the patient, Natasha Riddle,   The patient  have been given the opportunity to ask questions and make suggestions.  Lelan Pons, RN 01/07/2019, 2:21 AM

## 2019-01-07 NOTE — H&P (Signed)
Psychiatric Admission Assessment Adult  Patient Identification: Natasha Riddle MRN:  [id number] Date of Evaluation:  01/07/2019 Chief Complaint:  manic episodes Principal Diagnosis: Bipolar I disorder, most recent episode depressed (HCC) Diagnosis:  Principal Problem:   Bipolar I disorder, most recent episode depressed (HCC) Active Problems:   Amphetamine use disorder, severe (HCC)   Fibromyalgia  History of Present Illness: Patient seen and chart reviewed.  Patient came in with a chief complaint of "I wanted to hang myself".  Patient says that she was profoundly depressed felt completely hopeless and wanted to hang herself.  Family stopped her and the patient was cooperative with coming to the hospital.  Patient reports mood stays depressed but also extremely anxious all the time.  Feels nervous constantly.  She emphasizes that her body hurts all over all the time.  She thinks that she might of been diagnosed with lupus but she seems uncertain about it.  Patient admits that she went on a 2-week binge of amphetamines just prior to admission but insists that "I do not really do drugs".  She insists that her symptoms have been present even without substance abuse.  She has been going to the [facility] and receiving medication but has not been compliant with it.  She denies any hallucinations or psychotic symptoms.  Denies any thoughts of wanting to hurt anyone else.  Energy level feels poor.  Sleep is very disturbed at night. Associated Signs/Symptoms: Depression Symptoms:  depressed mood, anhedonia, insomnia, psychomotor retardation, feelings of worthlessness/guilt, difficulty concentrating, hopelessness, suicidal thoughts with specific plan, (Hypo) Manic Symptoms:  None reported Anxiety Symptoms:  Excessive Worry, Psychotic Symptoms:  None specifically reported PTSD Symptoms: Negative Total Time spent with patient: 1 hour  Past Psychiatric History: Patient's past history is a little bit  vague.  She has currently been following up with [facility] seeing her primary care doctor.  She says that she has been prescribed Wellbutrin and Depakote but will not take them.  Also prescribed Seroquel but is not taking that very reliably either.  She had been trying to get a work-up for her chronic pain.  She tells me she thinks that she was told she had lupus although after calling the [facility] they told me that her recent rheumatologic tests were nonrevealing.  There does not seem to be any specific indicator of lupus.  Patient says she has had suicide attempts in the past but the last time was in 2010 by overdose.  She has never been admitted to a psychiatric hospital before.  She says she is taken "almost every" psychiatric medicine but can only remember Seroquel Wellbutrin Depakote and lamotrigine.  Is the patient at risk to self? Yes.    Has the patient been a risk to self in the past 6 months? No.  Has the patient been a risk to self within the distant past? Yes.    Is the patient a risk to others? No.  Has the patient been a risk to others in the past 6 months? No.  Has the patient been a risk to others within the distant past? No.   Prior Inpatient Therapy:   Prior Outpatient Therapy:    Alcohol Screening: 1. How often do you have a drink containing alcohol?: Monthly or less 2. How many drinks containing alcohol do you have on a typical day when you are drinking?: 3 or 4 3. How often do you have six or more drinks on one occasion?: Less than monthly AUDIT-C  Score: 3 4. How often during the last year have you found that you were not able to stop drinking once you had started?: Less than monthly 5. How often during the last year have you failed to do what was normally expected from you becasue of drinking?: Less than monthly 6. How often during the last year have you needed a first drink in the morning to get yourself going after a heavy drinking session?: Less than monthly 7.  How often during the last year have you had a feeling of guilt of remorse after drinking?: Less than monthly 8. How often during the last year have you been unable to remember what happened the night before because you had been drinking?: Less than monthly 9. Have you or someone else been injured as a result of your drinking?: No 10. Has a relative or friend or a doctor or another health worker been concerned about your drinking or suggested you cut down?: No Alcohol Use Disorder Identification Test Final Score (AUDIT): 8 Alcohol Brief Interventions/Follow-up: Alcohol Education Substance Abuse History in the last 12 months:  Yes.   Consequences of Substance Abuse: Medical Consequences:  Worsening of depression to the point of being suicidal Previous Psychotropic Medications: Yes  Psychological Evaluations: Yes  Past Medical History:  Past Medical History:  Diagnosis Date  . Depression     Past Surgical History:  Procedure Laterality Date  . TUBAL LIGATION N/A 06/10/2017   Procedure: POST PARTUM TUBAL LIGATION;  Surgeon: Nadara Mustard, MD;  Location: [facility] ORS;  Service: Gynecology;  Laterality: N/A;   Family History: History reviewed. No pertinent family history. Family Psychiatric  History: Positive for anxiety Tobacco Screening: Have you used any form of tobacco in the last 30 days? (Cigarettes, Smokeless Tobacco, Cigars, and/or Pipes): Yes Tobacco use, Select all that apply: 5 or more cigarettes per day Are you interested in Tobacco Cessation Medications?: Yes, will notify MD for an order Counseled patient on smoking cessation including recognizing danger situations, developing coping skills and basic information about quitting provided: Yes Social History:  Social History   Substance and Sexual Activity  Alcohol Use No     Social History   Substance and Sexual Activity  Drug Use Yes  . Types: Methamphetamines    Additional Social History: Marital status: Married Number  of Years Married: 0 What types of issues is patient dealing with in the relationship?: Pt reports she got married September 2019, says her husband wants a divorce. Pt reports her MH, SA and her leaving the home for days without notifying anyone of her whereabouts has taken a toll on her marriage Additional relationship information: Pt has 1 child with husband, they both have children from previous relationships Are you sexually active?: Yes What is your sexual orientation?: Heterosexual Has your sexual activity been affected by drugs, alcohol, medication, or emotional stress?: No Does patient have children?: Yes How many children?: 3 How is patient's relationship with their children?: Pt reports 80 yr old son lives with her mother in [place]; 27 yr old daughter lives with her father in [place] he will not allow her to speak with the child; 1 yr old daughter lives in her home                         Allergies:  Not on File Lab Results:  Results for orders placed or performed during the hospital encounter of 01/07/19 (from the past 48 hour(s))  TSH  Status: None   Collection Time: 01/06/19 11:35 AM  Result Value Ref Range   TSH 0.946 0.350 - 4.500 uIU/mL    Comment: Performed by a 3rd Generation assay with a functional sensitivity of <=0.01 uIU/mL. Performed at [facility], [address] [place], [place] [address]     Blood Alcohol level:  Lab Results  Component Value Date   [facility] <10 01/06/2019    Metabolic Disorder Labs:  No results found for: HGBA1C, MPG No results found for: PROLACTIN No results found for: CHOL, TRIG, HDL, CHOLHDL, VLDL, LDLCALC  Current Medications: Current Facility-Administered Medications  Medication Dose Route Frequency Provider Last Rate Last Dose  . acetaminophen (TYLENOL) tablet 650 mg  650 mg Oral Q6H PRN He, Jun, MD      . alum & mag hydroxide-simeth (MAALOX/MYLANTA) 200-200-20 MG/5ML suspension 30 mL  30 mL Oral Q4H  PRN He, Jun, MD      . eye wash ((SODIUM/POTASSIUM/SOD CHLORIDE)) ophthalmic solution 2 drop  2 drop Both Eyes PRN He, Jun, MD      . gabapentin (NEURONTIN) capsule 400 mg  400 mg Oral TID ,  T, MD   400 mg at 01/07/19 1616  . hydrOXYzine (ATARAX/VISTARIL) tablet 50 mg  50 mg Oral Q6H PRN , Jackquline Denmark, MD   50 mg at 01/07/19 1442  . lithium carbonate capsule 600 mg  600 mg Oral QHS ,  T, MD      . magnesium hydroxide (MILK OF MAGNESIA) suspension 30 mL  30 mL Oral Daily PRN He, Jun, MD      . OLANZapine (ZYPREXA) tablet 5 mg  5 mg Oral q morning - 10a He, Jun, MD   5 mg at 01/07/19 0912  . OLANZapine (ZYPREXA) tablet 5 mg  5 mg Oral QHS He, Jun, MD      . traZODone (DESYREL) tablet 100 mg  100 mg Oral QHS PRN He, Jun, MD       PTA Medications: Medications Prior to Admission  Medication Sig Dispense Refill Last Dose  . clonazePAM (KLONOPIN) 1 MG tablet Take 1 mg by mouth at bedtime as needed for anxiety (sleep).   Unknown at PRN  . gabapentin (NEURONTIN) 400 MG capsule Take 400 mg by mouth 3 (three) times daily as needed (nerve pain).   Past Week at Unknown time  . HYDROcodone-acetaminophen (NORCO/VICODIN) 5-325 MG tablet Take 1 tablet by mouth 2 (two) times daily as needed for moderate pain or severe pain.   Unknown at PRN  . QUEtiapine (SEROQUEL) 200 MG tablet Take 200 mg by mouth at bedtime.   Past Week at Unknown time    Musculoskeletal: Strength & Muscle Tone: within normal limits Gait & Station: normal Patient leans: N/A  Psychiatric Specialty Exam: Physical Exam  Nursing note and vitals reviewed. Constitutional: She appears well-developed and well-nourished.  HENT:  Head: Normocephalic and atraumatic.  Eyes: Pupils are equal, round, and reactive to light. Conjunctivae are normal.  Neck: Normal range of motion.  Cardiovascular: Regular rhythm and normal heart sounds.  Respiratory: Effort normal.  GI: Soft.  Musculoskeletal: Normal range of motion.   Neurological: She is alert.  Skin: Skin is warm and dry.  Psychiatric: Her affect is blunt. Her speech is delayed. She is slowed and withdrawn. Cognition and memory are impaired. She expresses impulsivity. She exhibits a depressed mood. She expresses suicidal ideation. She expresses suicidal plans.    Review of Systems  Constitutional: Negative.   HENT: Negative.  Eyes: Negative.   Respiratory: Negative.   Cardiovascular: Negative.   Gastrointestinal: Negative.   Musculoskeletal: Positive for myalgias.  Skin: Negative.   Neurological: Negative.   Psychiatric/Behavioral: Positive for depression, memory loss, substance abuse and suicidal ideas. Negative for hallucinations. The patient is nervous/anxious and has insomnia.     Blood pressure 126/66, pulse 93, temperature 97.8 F (36.6 C), temperature source Oral, resp. rate 16, height  (1.626 m), weight 77.1 kg, last menstrual period 12/23/2018, SpO2 100 %, unknown if currently breastfeeding.Body mass index is 29.18 kg/m.  General Appearance: Disheveled  Eye Contact:  Minimal  Speech:  Slow  Volume:  Decreased  Mood:  Anxious  Affect:  Congruent  Thought Process:  Disorganized  Orientation:  Full (Time, Place, and Person)  Thought Content:  Illogical and Rumination  Suicidal Thoughts:  Yes.  with intent/plan  Homicidal Thoughts:  No  Memory:  Immediate;   Fair Recent;   Fair Remote;   Fair  Judgement:  Impaired  Insight:  Shallow  Psychomotor Activity:  Decreased  Concentration:  Concentration: Poor  Recall:  [employer] of Knowledge:  Fair  Language:  Fair  Akathisia:  No  Handed:  Right  AIMS (if indicated):     Assets:  Desire for Improvement Housing Resilience  ADL's:  Intact  Cognition:  WNL  Sleep:  Number of Hours: 3    Treatment Plan Summary: Daily contact with patient to assess and evaluate symptoms and progress in treatment, Medication management and Plan Patient presents as very anxious and a little  disorganized but has good reality testing.  Does not appear to be hallucinating no signs of delusions.  We discussed several medications and everything I suggested seemed wrong to her although she could not really describe why.  I eventually suggested lithium which she apparently has not been on in the past.  She is agreeable to trying this for mood stabilization and suicidal thoughts.  600 mg to start at night.  Patient will not be given controlled substances while she is here.  I suspect she may be minimizing the problems with substances.  A 2-week methamphetamine binge is pretty significant.  Include patient in groups on the unit.  Individual reassessment daily.  We will make sure she has appropriate referral to outpatient treatment at discharge.  Observation Level/Precautions:  15 minute checks  Laboratory:  UDS  Psychotherapy:    Medications:    Consultations:    Discharge Concerns:    Estimated LOS:  Other:     Physician Treatment Plan for Primary Diagnosis: Bipolar I disorder, most recent episode depressed (HCC) Long Term Goal(s): Improvement in symptoms so as ready for discharge  Short Term Goals: Ability to verbalize feelings will improve, Ability to disclose and discuss suicidal ideas and Ability to demonstrate self-control will improve  Physician Treatment Plan for Secondary Diagnosis: Principal Problem:   Bipolar I disorder, most recent episode depressed (HCC) Active Problems:   Amphetamine use disorder, severe (HCC)   Fibromyalgia  Long Term Goal(s): Improvement in symptoms so as ready for discharge  Short Term Goals: Ability to identify triggers associated with substance abuse/mental health issues will improve  I certify that inpatient services furnished can reasonably be expected to improve the patient's condition.    Mordecai Rasmussen, MD 5/18/20204:31 PM

## 2019-01-07 NOTE — BHH Suicide Risk Assessment (Signed)
[facility] Admission Suicide Risk Assessment   Nursing information obtained from:  Patient Demographic factors:  Low socioeconomic status Current Mental Status:  NA Loss Factors:  NA Historical Factors:  NA Risk Reduction Factors:  NA  Total Time spent with patient: 1 hour Principal Problem: Bipolar I disorder, most recent episode depressed (HCC) Diagnosis:  Principal Problem:   Bipolar I disorder, most recent episode depressed (HCC) Active Problems:   Amphetamine use disorder, severe (HCC)   Fibromyalgia  Subjective Data: Patient seen chart reviewed.  See intake.  Patient presented with a specific desire to hang herself.  Continues to be depressed anxious and somewhat distraught.  Still has suicidal thoughts but does not appear to have intent to act on it in the hospital.  Does not appear to have acute psychotic symptoms.  No homicidal ideation.  Cooperative and agreeable to treatment  Continued Clinical Symptoms:  Alcohol Use Disorder Identification Test Final Score (AUDIT): 8 The "Alcohol Use Disorders Identification Test", Guidelines for Use in Primary Care, Second Edition.  World Science writer [facility]). Score between 0-7:  no or low risk or alcohol related problems. Score between 8-15:  moderate risk of alcohol related problems. Score between 16-19:  high risk of alcohol related problems. Score 20 or above:  warrants further diagnostic evaluation for alcohol dependence and treatment.   CLINICAL FACTORS:   Depression:   Comorbid alcohol abuse/dependence   Musculoskeletal: Strength & Muscle Tone: within normal limits Gait & Station: normal Patient leans: N/A  Psychiatric Specialty Exam: Physical Exam  Nursing note and vitals reviewed. Constitutional: She appears well-developed and well-nourished.  HENT:  Head: Normocephalic and atraumatic.  Eyes: Pupils are equal, round, and reactive to light. Conjunctivae are normal.  Neck: Normal range of motion.  Cardiovascular:  Regular rhythm and normal heart sounds.  Respiratory: Effort normal. No respiratory distress.  GI: Soft.  Musculoskeletal: Normal range of motion.  Neurological: She is alert.  Skin: Skin is warm and dry.  Psychiatric: Her mood appears anxious. Her speech is tangential. She is agitated and withdrawn. She is not aggressive. Cognition and memory are normal. She expresses impulsivity. She expresses suicidal ideation. She expresses suicidal plans.    Review of Systems  Constitutional: Negative.   HENT: Negative.   Eyes: Negative.   Respiratory: Negative.   Cardiovascular: Negative.   Gastrointestinal: Negative.   Musculoskeletal: Negative.   Skin: Negative.   Neurological: Negative.   Psychiatric/Behavioral: Positive for depression, substance abuse and suicidal ideas. Negative for hallucinations. The patient is nervous/anxious and has insomnia.     Blood pressure 126/66, pulse 93, temperature 97.8 F (36.6 C), temperature source Oral, resp. rate 16, height 5\' 4"  (1.626 m), weight 77.1 kg, last menstrual period 12/23/2018, SpO2 100 %, unknown if currently breastfeeding.Body mass index is 29.18 kg/m.  General Appearance: Casual  Eye Contact:  Fair  Speech:  Garbled  Volume:  Increased  Mood:  Anxious  Affect:  Congruent  Thought Process:  Goal Directed  Orientation:  Full (Time, Place, and Person)  Thought Content:  Paranoid Ideation  Suicidal Thoughts:  Yes.  with intent/plan  Homicidal Thoughts:  No  Memory:  Immediate;   Fair Recent;   Fair Remote;   Fair  Judgement:  Impaired  Insight:  Shallow  Psychomotor Activity:  Decreased  Concentration:  Concentration: Fair  Recall:  [employer] of Knowledge:  Fair  Language:  Fair  Akathisia:  No  Handed:  Right  AIMS (if indicated):     Assets:  Desire for Improvement  ADL's:  Intact  Cognition:  WNL  Sleep:  Number of Hours: 3      COGNITIVE FEATURES THAT CONTRIBUTE TO RISK:  Loss of executive function    SUICIDE RISK:    Mild:  Suicidal ideation of limited frequency, intensity, duration, and specificity.  There are no identifiable plans, no associated intent, mild dysphoria and related symptoms, good self-control (both objective and subjective assessment), few other risk factors, and identifiable protective factors, including available and accessible social support.  PLAN OF CARE: Patient will remain on 15-minute checks.  Medication management and therapy and daily reassessment as usual.  Reassess to assure improved safety at discharge.  I certify that inpatient services furnished can reasonably be expected to improve the patient's condition.   Mordecai RasmussenJohn Clapacs, MD 01/07/2019, 4:37 PM

## 2019-01-07 NOTE — Progress Notes (Signed)
Recreation Therapy Notes  INPATIENT RECREATION THERAPY ASSESSMENT  Patient Details Name: Natasha Riddle MRN: [id number] DOB: 1986-06-09 Today's Date: 01/07/2019       Information Obtained From: Patient  Able to Participate in Assessment/Interview: Yes  Patient Presentation: Responsive  Reason for Admission (Per Patient): Active Symptoms, Suicidal Ideation, Suicide Attempt  Patient Stressors: (Life)  Coping Skills:   (None)  Leisure Interests (2+):  (Nothing)  Frequency of Recreation/Participation:    Awareness of Community Resources:     [employer]:     Current Use:    If no, Barriers?:    Expressed Interest in [employer] Information:    [place] of Residence:  Film/video editor  Patient Main Form of Transportation: Other (Comment)(My husband)  Patient Strengths:  Nothing  Patient Identified Areas of Improvement:  Getting better  Patient Goal for Hospitalization:  To get better  Current SI (including self-harm):  No  Current HI:  No  Current AVH: No  Staff Intervention Plan: Group Attendance, Collaborate with Interdisciplinary Treatment Team  Consent to Intern Participation: N/A  Natasha Riddle 01/07/2019, 1:35 PM

## 2019-01-07 NOTE — Plan of Care (Signed)
Patient is very tearful today, complained of increased anxiety and asked several times for medications by name. "I need Vicodan and Ativan. I have Lupas so the Vicodan will help with the pain. I take Ativan at home for anxiety. Please help me ma'am." MD made aware of patient's anxiety, Vistaril ordered prn. Patient withdrawn from the milieu. In her room mostly today crying. Did not attend group sessions today. Will continue to monitor.

## 2019-01-07 NOTE — Progress Notes (Signed)
Recreation Therapy Notes  Date: 01/07/2019  Time: 9:30 am   Location: Craft room   Behavioral response: N/A   Intervention Topic: Problem Solving   Discussion/Intervention: Patient did not attend group.   Clinical Observations/Feedback:  Patient did not attend group.   Christe Tellez LRT/CTRS        Natasha Riddle 01/07/2019 11:07 AM 

## 2019-01-07 NOTE — ED Notes (Signed)
Patient has been accepted to [facility].  Patient assigned to room 323 Accepting physician is Dr. He.  Call report to [phone].  Representative was [employer].   ER Staff is aware of it:  [facility] ER Secretary  Dr. York Cerise, ER MD  Dewayne Hatch Patient's Nurse

## 2019-01-07 NOTE — Tx Team (Addendum)
Interdisciplinary Treatment and Diagnostic Plan Update  01/07/2019 Time of Session: 1030am Natasha Riddle MRN: [id number]  Principal Diagnosis: <principal problem not specified>  Secondary Diagnoses: Active Problems:   Amphetamine use disorder, severe (HCC)   Current Medications:  Current Facility-Administered Medications  Medication Dose Route Frequency Provider Last Rate Last Dose  . acetaminophen (TYLENOL) tablet 650 mg  650 mg Oral Q6H PRN He, Jun, MD      . alum & mag hydroxide-simeth (MAALOX/MYLANTA) 200-200-20 MG/5ML suspension 30 mL  30 mL Oral Q4H PRN He, Jun, MD      . eye wash ((SODIUM/POTASSIUM/SOD CHLORIDE)) ophthalmic solution 2 drop  2 drop Both Eyes PRN He, Jun, MD      . magnesium hydroxide (MILK OF MAGNESIA) suspension 30 mL  30 mL Oral Daily PRN He, Jun, MD      . OLANZapine (ZYPREXA) tablet 5 mg  5 mg Oral q morning - 10a He, Jun, MD   5 mg at 01/07/19 0912  . OLANZapine (ZYPREXA) tablet 5 mg  5 mg Oral QHS He, Jun, MD      . traZODone (DESYREL) tablet 100 mg  100 mg Oral QHS PRN He, Jun, MD       PTA Medications: Medications Prior to Admission  Medication Sig Dispense Refill Last Dose  . clonazePAM (KLONOPIN) 1 MG tablet Take 1 mg by mouth at bedtime as needed for anxiety (sleep).   Unknown at PRN  . gabapentin (NEURONTIN) 400 MG capsule Take 400 mg by mouth 3 (three) times daily as needed (nerve pain).   Past Week at Unknown time  . HYDROcodone-acetaminophen (NORCO/VICODIN) 5-325 MG tablet Take 1 tablet by mouth 2 (two) times daily as needed for moderate pain or severe pain.   Unknown at PRN  . QUEtiapine (SEROQUEL) 200 MG tablet Take 200 mg by mouth at bedtime.   Past Week at Unknown time    Patient Stressors: Financial difficulties Health problems Marital or family conflict Substance abuse  Patient Strengths: Average or above average intelligence Capable of independent living Armed forces logistics/support/administrative officer Motivation for treatment/growth Supportive  family/friends  Treatment Modalities: Medication Management, Group therapy, Case management,  1 to 1 session with clinician, Psychoeducation, Recreational therapy.   Physician Treatment Plan for Primary Diagnosis: <principal problem not specified> Long Term Goal(s):     Short Term Goals:    Medication Management: Evaluate patient's response, side effects, and tolerance of medication regimen.  Therapeutic Interventions: 1 to 1 sessions, Unit Group sessions and Medication administration.  Evaluation of Outcomes: Not Met  Physician Treatment Plan for Secondary Diagnosis: Active Problems:   Amphetamine use disorder, severe ([facility])  Long Term Goal(s):     Short Term Goals:       Medication Management: Evaluate patient's response, side effects, and tolerance of medication regimen.  Therapeutic Interventions: 1 to 1 sessions, Unit Group sessions and Medication administration.  Evaluation of Outcomes: Not Met   RN Treatment Plan for Primary Diagnosis: <principal problem not specified> Long Term Goal(s): Knowledge of disease and therapeutic regimen to maintain health will improve  Short Term Goals: Ability to participate in decision making will improve, Ability to verbalize feelings will improve, Ability to disclose and discuss suicidal ideas, Ability to identify and develop effective coping behaviors will improve and Compliance with prescribed medications will improve  Medication Management: RN will administer medications as ordered by provider, will assess and evaluate patient's response and provide education to patient for prescribed medication. RN will report any adverse and/or side effects to  prescribing provider.  Therapeutic Interventions: 1 on 1 counseling sessions, Psychoeducation, Medication administration, Evaluate responses to treatment, Monitor vital signs and CBGs as ordered, Perform/monitor CIWA, COWS, AIMS and Fall Risk screenings as ordered, Perform wound care treatments  as ordered.  Evaluation of Outcomes: Not Met   LCSW Treatment Plan for Primary Diagnosis: <principal problem not specified> Long Term Goal(s): Safe transition to appropriate next level of care at discharge, Engage patient in therapeutic group addressing interpersonal concerns.  Short Term Goals: Engage patient in aftercare planning with referrals and resources  Therapeutic Interventions: Assess for all discharge needs, 1 to 1 time with Social worker, Explore available resources and support systems, Assess for adequacy in community support network, Educate family and significant other(s) on suicide prevention, Complete Psychosocial Assessment, Interpersonal group therapy.  Evaluation of Outcomes: Not Met   Progress in Treatment: Attending groups: No. Participating in groups: No. Taking medication as prescribed: Yes. Toleration medication: Yes. Family/Significant other contact made: Yes, individual(s) contacted:  Susanne Greenhouse, mother Patient understands diagnosis: Yes. Discussing patient identified problems/goals with staff: Yes. Medical problems stabilized or resolved: No. Denies suicidal/homicidal ideation: No. Issues/concerns per patient self-inventory: No. Other: N/A  New problem(s) identified: No, Describe:  none reported  New Short Term/Long Term Goal(s):Attend outpatient treatment, take medication as prescribed, develop and implement healthy coping methods to manage stressors.  Patient Goals:  "To get better"  Discharge Plan or Barriers: Pt will return home and follow up with outpatient treatment  Reason for Continuation of Hospitalization: Anxiety Depression Medication stabilization Suicidal ideation  Estimated Length of Stay: 3-5 days    Attendees: Patient:Natasha Riddle 01/07/2019 11:20 AM  Physician: Alethia Berthold 01/07/2019 11:20 AM  Nursing: Nat Math Ravenell 01/07/2019 11:20 AM  RN Care Manager: 01/07/2019 11:20 AM  Social Worker: Thedore Mins [place]  Gorst Stanfield 01/07/2019 11:20 AM  Recreational Therapist: Isaias Sakai Rida Loudin 01/07/2019 11:20 AM  Other:  01/07/2019 11:20 AM  Other:  01/07/2019 11:20 AM  Other: 01/07/2019 11:20 AM    Scribe for Treatment Team: Yvette Rack, LCSW 01/07/2019 11:20 AM

## 2019-01-07 NOTE — [facility] Group Notes (Signed)
LCSW Group Therapy Note   01/07/2019 1:00 PM  Type of Therapy and Topic:  Group Therapy:  Overcoming Obstacles   Participation Level:  Minimal   Description of Group:    In this group patients will be encouraged to explore what they see as obstacles to their own wellness and recovery. They will be guided to discuss their thoughts, feelings, and behaviors related to these obstacles. The group will process together ways to cope with barriers, with attention given to specific choices patients can make. Each patient will be challenged to identify changes they are motivated to make in order to overcome their obstacles. This group will be process-oriented, with patients participating in exploration of their own experiences as well as giving and receiving support and challenge from other group members.   Therapeutic Goals: 1. Patient will identify personal and current obstacles as they relate to admission. 2. Patient will identify barriers that currently interfere with their wellness or overcoming obstacles.  3. Patient will identify feelings, thought process and behaviors related to these barriers. 4. Patient will identify two changes they are willing to make to overcome these obstacles:      Summary of Patient Progress Patient was present for beginning of group, however, left reporting that she was going to the bathroom.  Patient did not return. Patient sat with her head down while in group.    Therapeutic Modalities:   Cognitive Behavioral Therapy Solution Focused Therapy Motivational Interviewing Relapse Prevention Therapy  Penni Homans, MSW, LCSW 01/07/2019 12:50 PM
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
## 2019-01-08 MED ORDER — DULOXETINE HCL 20 MG PO CPEP
20.0000 mg | ORAL_CAPSULE | Freq: Every day | ORAL | Status: DC
  Administered 2019-01-08 – 2019-01-11 (×4): 20 mg via ORAL
  Filled 2019-01-08 (×5): qty 1

## 2019-01-08 MED ORDER — IBUPROFEN 600 MG PO TABS
600.0000 mg | ORAL_TABLET | Freq: Four times a day (QID) | ORAL | Status: DC | PRN
  Administered 2019-01-08 – 2019-01-11 (×6): 600 mg via ORAL
  Filled 2019-01-08 (×6): qty 1

## 2019-01-08 MED ORDER — PREGABALIN 25 MG PO CAPS
25.0000 mg | ORAL_CAPSULE | Freq: Two times a day (BID) | ORAL | Status: DC
  Administered 2019-01-08 – 2019-01-11 (×6): 25 mg via ORAL
  Filled 2019-01-08 (×6): qty 1

## 2019-01-08 MED ORDER — QUETIAPINE FUMARATE 200 MG PO TABS
200.0000 mg | ORAL_TABLET | Freq: Every day | ORAL | Status: DC
  Administered 2019-01-08 – 2019-01-10 (×3): 200 mg via ORAL
  Filled 2019-01-08 (×3): qty 1

## 2019-01-08 NOTE — [facility] Suicide Risk Assessment (Signed)
[facility] INPATIENT:  Family/Significant Other Suicide Prevention Education  Suicide Prevention Education:  Contact Attempts: [name], mother [phone] has been identified by the patient as the family member/significant other with whom the patient will be residing, and identified as the person(s) who will aid the patient in the event of a mental health crisis.  With written consent from the patient, two attempts were made to provide suicide prevention education, prior to and/or following the patient's discharge.  We were unsuccessful in providing suicide prevention education.  A suicide education pamphlet was given to the patient to share with family/significant other.  Date and time of first attempt:01/08/19 209pm left confidential vm Date and time of second attempt:  Natasha Riddle 01/08/2019, 2:10 PM

## 2019-01-08 NOTE — [facility] Group Notes (Signed)
Feelings Around Diagnosis 01/08/2019 1PM  Type of Therapy/Topic:  Group Therapy:  Feelings about Diagnosis  Participation Level:  Did Not Attend   Description of Group:   This group will allow patients to explore their thoughts and feelings about diagnoses they have received. Patients will be guided to explore their level of understanding and acceptance of these diagnoses. Facilitator will encourage patients to process their thoughts and feelings about the reactions of others to their diagnosis and will guide patients in identifying ways to discuss their diagnosis with significant others in their lives. This group will be process-oriented, with patients participating in exploration of their own experiences, giving and receiving support, and processing challenge from other group members.   Therapeutic Goals: 1. Patient will demonstrate understanding of diagnosis as evidenced by identifying two or more symptoms of the disorder 2. Patient will be able to express two feelings regarding the diagnosis 3. Patient will demonstrate their ability to communicate their needs through discussion and/or role play  Summary of Patient Progress:       Therapeutic Modalities:   Cognitive Behavioral Therapy Brief Therapy Feelings Identification    Ashmi Blas T Ariez Neilan, LCSW 01/08/2019 2:05 PM  

## 2019-01-08 NOTE — Progress Notes (Signed)
D - Patient was in her room upon arrival to the unit. Patient was pleasant during assessment and medication administration. Patient denies SI/HI/AVH. Patient endorses anxiety and depression rating them 5/10. Regarding pain the patient stated, "I told the doctor how much pain I am in all the time but he didn't care and he didn't prescribe me any pain medicine." Patient given education about her medications.   A - Patient compliant with medication administration per MD orders and procedures on the unit. Patient given education. Patient given support and encouragement to be active in her treatment plan. Patient informed to let staff know if there are any issues or problems on the unit.   R - Patient being monitored Q 15 minutes for safety per unit protocol. Patient remains safe on the unit.

## 2019-01-08 NOTE — Plan of Care (Signed)
Pt staes "haven't slept". Goal today is "to sleep". Pt rates depression 8/10 and anxiety 5/10. Pt denies AVH, SI and HI. Pt was educated on care plan and verbalizes understanding. Torrie Mayers RN Problem: Education: Goal: Ability to make informed decisions regarding treatment will improve Outcome: Progressing   Problem: Coping: Goal: Coping ability will improve Outcome: Progressing   Problem: Health Behavior/Discharge Planning: Goal: Identification of resources available to assist in meeting health care needs will improve Outcome: Progressing   Problem: Medication: Goal: Compliance with prescribed medication regimen will improve Outcome: Progressing   Problem: Self-Concept: Goal: Ability to disclose and discuss suicidal ideas will improve Outcome: Progressing Goal: Will verbalize positive feelings about self Outcome: Not Progressing   Problem: Self-Concept: Goal: Ability to disclose and discuss suicidal ideas will improve Outcome: Progressing Goal: Will verbalize positive feelings about self Outcome: Not Progressing   Problem: Education: Goal: Utilization of techniques to improve thought processes will improve Outcome: Progressing Goal: Knowledge of the prescribed therapeutic regimen will improve Outcome: Progressing   Problem: Activity: Goal: Interest or engagement in leisure activities will improve Outcome: Progressing Goal: Imbalance in normal sleep/wake cycle will improve Outcome: Progressing   Problem: Coping: Goal: Coping ability will improve Outcome: Not Progressing Goal: Will verbalize feelings Outcome: Progressing   Problem: Health Behavior/Discharge Planning: Goal: Ability to make decisions will improve Outcome: Progressing Goal: Compliance with therapeutic regimen will improve Outcome: Progressing   Problem: Role Relationship: Goal: Will demonstrate positive changes in social behaviors and relationships Outcome: Progressing   Problem:  Education: Goal: Ability to state activities that reduce stress will improve Outcome: Not Progressing   Problem: Coping: Goal: Ability to identify and develop effective coping behavior will improve Outcome: Progressing   Problem: Self-Concept: Goal: Ability to identify factors that promote anxiety will improve Outcome: Progressing Goal: Level of anxiety will decrease Outcome: Progressing Goal: Ability to modify response to factors that promote anxiety will improve Outcome: Progressing   Problem: Self-Concept: Goal: Ability to identify factors that promote anxiety will improve Outcome: Progressing Goal: Level of anxiety will decrease Outcome: Progressing Goal: Ability to modify response to factors that promote anxiety will improve Outcome: Progressing   Problem: Activity: Goal: Will identify at least one activity in which they can participate Outcome: Progressing   Problem: Coping: Goal: Ability to identify and develop effective coping behavior will improve Outcome: Progressing Goal: Ability to interact with others will improve Outcome: Progressing Goal: Demonstration of participation in decision-making regarding own care will improve Outcome: Progressing Goal: Ability to use eye contact when communicating with others will improve Outcome: Progressing   Problem: Coping: Goal: Ability to identify and develop effective coping behavior will improve Outcome: Progressing Goal: Ability to interact with others will improve Outcome: Progressing Goal: Demonstration of participation in decision-making regarding own care will improve Outcome: Progressing Goal: Ability to use eye contact when communicating with others will improve Outcome: Progressing   Problem: Health Behavior/Discharge Planning: Goal: Identification of resources available to assist in meeting health care needs will improve Outcome: Progressing

## 2019-01-08 NOTE — Progress Notes (Signed)
[facility] MD Progress Note  01/08/2019 4:08 PM Natasha Riddle  MRN:  [id number] Subjective: Patient seen chart reviewed.  This is a young woman with bipolar disorder and recent substance abuse.  Patient today is chiefly complaining of pain.  She was in bed and complaining bitterly of severe back pain which she says radiates down her legs but also is starting to be felt throughout her entire body.  She says this is the chronic pain issue she has been dealing with for years.  Patient says as a result of the pain she is unable to get any sleep at night.  Mood remains very anxious and dysphoric although she denies specific suicidal intent.  Tearful.  Not able to attend groups.  Does not seem to be delusional or psychotic. Principal Problem: Bipolar I disorder, most recent episode depressed (HCC) Diagnosis: Principal Problem:   Bipolar I disorder, most recent episode depressed (HCC) Active Problems:   Amphetamine use disorder, severe (HCC)   Fibromyalgia  Total Time spent with patient: 30 minutes  Past Psychiatric History: Patient has a past history of mood instability with previous hospitalizations.  We have little access to much of her old history.  Seems to also have chronic substance abuse and somatic concerns.  Past Medical History:  Past Medical History:  Diagnosis Date  . Depression     Past Surgical History:  Procedure Laterality Date  . TUBAL LIGATION N/A 06/10/2017   Procedure: POST PARTUM TUBAL LIGATION;  Surgeon: Nadara Mustard, MD;  Location: [facility] ORS;  Service: Gynecology;  Laterality: N/A;   Family History: History reviewed. No pertinent family history. Family Psychiatric  History: See previous Social History:  Social History   Substance and Sexual Activity  Alcohol Use No     Social History   Substance and Sexual Activity  Drug Use Yes  . Types: Methamphetamines    Social History   Socioeconomic History  . Marital status: Single    Spouse name: Not on file  . Number  of children: Not on file  . Years of education: Not on file  . Highest education level: Not on file  Occupational History  . Not on file  Social Needs  . Financial resource strain: Not on file  . Food insecurity:    Worry: Not on file    Inability: Not on file  . Transportation needs:    Medical: Not on file    Non-medical: Not on file  Tobacco Use  . Smoking status: Current Every Day Smoker    Packs/day: 0.50    Types: Cigarettes  . Smokeless tobacco: Never Used  Substance and Sexual Activity  . Alcohol use: No  . Drug use: Yes    Types: Methamphetamines  . Sexual activity: Yes    Birth control/protection: Surgical    Comment: planning tubal ligation  Lifestyle  . Physical activity:    Days per week: Not on file    Minutes per session: Not on file  . Stress: Not on file  Relationships  . Social connections:    Talks on phone: Not on file    Gets together: Not on file    Attends religious service: Not on file    Active member of club or organization: Not on file    Attends meetings of clubs or organizations: Not on file    Relationship status: Not on file  Other Topics Concern  . Not on file  Social History Narrative  . Not on file  Additional Social History:                         Sleep: Poor  Appetite:  Fair  Current Medications: Current Facility-Administered Medications  Medication Dose Route Frequency Provider Last Rate Last Dose  . acetaminophen (TYLENOL) tablet 650 mg  650 mg Oral Q6H PRN He, Jun, MD   650 mg at 01/08/19 0818  . alum & mag hydroxide-simeth (MAALOX/MYLANTA) 200-200-20 MG/5ML suspension 30 mL  30 mL Oral Q4H PRN He, Jun, MD      . DULoxetine (CYMBALTA) DR capsule 20 mg  20 mg Oral Daily Clapacs, John T, MD   20 mg at 01/08/19 1556  . eye wash ((SODIUM/POTASSIUM/SOD CHLORIDE)) ophthalmic solution 2 drop  2 drop Both Eyes PRN He, Jun, MD      . hydrOXYzine (ATARAX/VISTARIL) tablet 50 mg  50 mg Oral Q6H PRN Clapacs, Jackquline DenmarkJohn T, MD    50 mg at 01/07/19 1442  . ibuprofen (ADVIL) tablet 600 mg  600 mg Oral Q6H PRN Clapacs, Jackquline DenmarkJohn T, MD   600 mg at 01/08/19 1556  . lithium carbonate capsule 600 mg  600 mg Oral QHS Clapacs, Jackquline DenmarkJohn T, MD   600 mg at 01/07/19 2119  . magnesium hydroxide (MILK OF MAGNESIA) suspension 30 mL  30 mL Oral Daily PRN He, Jun, MD      . pregabalin (LYRICA) capsule 25 mg  25 mg Oral BID Clapacs, John T, MD      . QUEtiapine (SEROQUEL) tablet 200 mg  200 mg Oral QHS Clapacs, John T, MD      . traZODone (DESYREL) tablet 100 mg  100 mg Oral QHS PRN He, Jun, MD   100 mg at 01/07/19 2119    Lab Results:  Results for orders placed or performed during the hospital encounter of 01/06/19 (from the past 48 hour(s))  SARS Coronavirus 2 (CEPHEID - Performed in [facility] lab), Hosp Order     Status: None   Collection Time: 01/06/19  5:05 PM  Result Value Ref Range   SARS Coronavirus 2 NEGATIVE NEGATIVE    Comment: (NOTE) If result is NEGATIVE SARS-CoV-2 target nucleic acids are NOT DETECTED. The SARS-CoV-2 RNA is generally detectable in upper and lower  respiratory specimens during the acute phase of infection. The lowest  concentration of SARS-CoV-2 viral copies this assay can detect is 250  copies / mL. A negative result does not preclude SARS-CoV-2 infection  and should not be used as the sole basis for treatment or other  patient management decisions.  A negative result may occur with  improper specimen collection / handling, submission of specimen other  than nasopharyngeal swab, presence of viral mutation(s) within the  areas targeted by this assay, and inadequate number of viral copies  (<250 copies / mL). A negative result must be combined with clinical  observations, patient history, and epidemiological information. If result is POSITIVE SARS-CoV-2 target nucleic acids are DETECTED. The SARS-CoV-2 RNA is generally detectable in upper and lower  respiratory specimens dur ing the acute phase of  infection.  Positive  results are indicative of active infection with SARS-CoV-2.  Clinical  correlation with patient history and other diagnostic information is  necessary to determine patient infection status.  Positive results do  not rule out bacterial infection or co-infection with other viruses. If result is PRESUMPTIVE POSTIVE SARS-CoV-2 nucleic acids MAY BE PRESENT.   A presumptive positive result was obtained on the  submitted specimen  and confirmed on repeat testing.  While 2019 novel coronavirus  (SARS-CoV-2) nucleic acids may be present in the submitted sample  additional confirmatory testing may be necessary for epidemiological  and / or clinical management purposes  to differentiate between  SARS-CoV-2 and other Sarbecovirus currently known to infect humans.  If clinically indicated additional testing with an alternate test  methodology [phone]) is advised. The SARS-CoV-2 RNA is generally  detectable in upper and lower respiratory sp ecimens during the acute  phase of infection. The expected result is Negative. Fact Sheet for Patients:  BoilerBrush.com.cy Fact Sheet for Healthcare Providers: https://pope.com/ This test is not yet approved or cleared by the [place] FDA and has been authorized for detection and/or diagnosis of SARS-CoV-2 by FDA under an Emergency Use Authorization (EUA).  This EUA will remain in effect (meaning this test can be used) for the duration of the COVID-19 declaration under Section 564(b)(1) of the Act, 21 U.S.C. section 360bbb-3(b)(1), unless the authorization is terminated or revoked sooner. Performed at [facility], [address] [place], [place] [address]     Blood Alcohol level:  Lab Results  Component Value Date   [facility] <10 01/06/2019    Metabolic Disorder Labs: No results found for: HGBA1C, MPG No results found for: PROLACTIN No results found for: CHOL, TRIG, HDL,  CHOLHDL, VLDL, LDLCALC  Physical Findings: AIMS: Facial and Oral Movements Muscles of Facial Expression: None, normal Lips and Perioral Area: None, normal Jaw: None, normal Tongue: None, normal,Extremity Movements Upper (arms, wrists, hands, fingers): None, normal Lower (legs, knees, ankles, toes): None, normal, Trunk Movements Neck, shoulders, hips: None, normal, Overall Severity Severity of abnormal movements (highest score from questions above): None, normal Incapacitation due to abnormal movements: None, normal Patient's awareness of abnormal movements (rate only patient's report): No Awareness, Dental Status Current problems with teeth and/or dentures?: No Does patient usually wear dentures?: No  CIWA:  CIWA-Ar Total: 3 COWS:  COWS Total Score: 3  Musculoskeletal: Strength & Muscle Tone: within normal limits Gait & Station: unsteady Patient leans: N/A  Psychiatric Specialty Exam: Physical Exam  Nursing note and vitals reviewed. Constitutional: She appears well-developed and well-nourished.  HENT:  Head: Normocephalic and atraumatic.  Eyes: Pupils are equal, round, and reactive to light. Conjunctivae are normal.  Neck: Normal range of motion.  Cardiovascular: Regular rhythm and normal heart sounds.  Respiratory: Effort normal. No respiratory distress.  GI: Soft.  Musculoskeletal: Normal range of motion.  Neurological: She is alert.  Skin: Skin is warm and dry.  Psychiatric: Her mood appears anxious. Her affect is blunt. Her speech is tangential. She is agitated. She is not aggressive. Thought content is not paranoid. Cognition and memory are impaired. She expresses impulsivity. She expresses no homicidal and no suicidal ideation.    Review of Systems  Constitutional: Negative.   HENT: Negative.   Eyes: Negative.   Respiratory: Negative.   Cardiovascular: Negative.   Gastrointestinal: Negative.   Musculoskeletal: Positive for back pain, joint pain, myalgias and neck  pain.  Skin: Negative.   Neurological: Negative.   Psychiatric/Behavioral: Positive for depression. Negative for hallucinations, substance abuse and suicidal ideas. The patient is nervous/anxious and has insomnia.     Blood pressure 114/79, pulse 73, temperature 98.6 F (37 C), temperature source Oral, resp. rate 16, height 5\' 4"  (1.626 m), weight 77.1 kg, last menstrual period 12/23/2018, SpO2 100 %, unknown if currently breastfeeding.Body mass index is 29.18 kg/m.  General Appearance: [employer]  Contact:  Minimal  Speech:  Garbled  Volume:  Normal  Mood:  Anxious, Dysphoric and Irritable  Affect:  Congruent  Thought Process:  Disorganized  Orientation:  Full (Time, Place, and Person)  Thought Content:  Rumination and Tangential  Suicidal Thoughts:  No  Homicidal Thoughts:  No  Memory:  Immediate;   Fair Recent;   Fair Remote;   Fair  Judgement:  Impaired  Insight:  Shallow  Psychomotor Activity:  Restlessness  Concentration:  Concentration: Poor  Recall:  [employer] of Knowledge:  Fair  Language:  Fair  Akathisia:  No  Handed:  Right  AIMS (if indicated):     Assets:  Desire for Improvement Resilience  ADL's:  Impaired  Cognition:  Impaired,  Mild  Sleep:  Number of Hours: 7.5     Treatment Plan Summary: Daily contact with patient to assess and evaluate symptoms and progress in treatment, Medication management and Plan Patient with mood instability.  Claims that she slept almost none last night although her hours of sleep are charted as being 7-1/2.  Patient complains bitterly of back pain.  I think that part of this could be opiate withdrawal.  Obviously the anxiety and mood symptoms are contributing to the pain and vice versa.  I reviewed medication with her and suggested that she except adding back the Seroquel tonight for her sleep.  She is agreeable to that.  For now we will continue the lithium that was started.  Trying to address her pain however I am also  starting her on a low-dose of Lyrica and a very low dose of Cymbalta.  Patient encouraged to have patience and when she can to attend groups more.  Still very disorganized and agitated in her thinking.  Mordecai Rasmussen, MD 01/08/2019, 4:08 PM

## 2019-01-08 NOTE — Plan of Care (Signed)
Patient compliant with medication administration per MD orders  Problem: Medication: Goal: Compliance with prescribed medication regimen will improve Outcome: Progressing   

## 2019-01-08 NOTE — Progress Notes (Signed)
Recreation Therapy Notes  Date: 01/08/2019  Time: 9:30 am   Location: Craft room   Behavioral response: N/A   Intervention Topic: Self-esteem  Discussion/Intervention: Patient did not attend group.   Clinical Observations/Feedback:  Patient did not attend group.   Itzamara Casas LRT/CTRS        Natasha Riddle 01/08/2019 11:03 AM 

## 2019-01-09 NOTE — Plan of Care (Addendum)
Pt rates anxiety 6/10 and depression 8/10. Pt denies SI. HI and AVH. Pt was educated on care plan and verbalizes understanding. Torrie Mayers RN Problem: Education: Goal: Ability to make informed decisions regarding treatment will improve Outcome: Progressing   Problem: Coping: Goal: Coping ability will improve Outcome: Progressing   Problem: Health Behavior/Discharge Planning: Goal: Identification of resources available to assist in meeting health care needs will improve Outcome: Progressing   Problem: Medication: Goal: Compliance with prescribed medication regimen will improve Outcome: Progressing   Problem: Self-Concept: Goal: Ability to disclose and discuss suicidal ideas will improve Outcome: Progressing Goal: Will verbalize positive feelings about self Outcome: Not Progressing   Problem: Education: Goal: Utilization of techniques to improve thought processes will improve Outcome: Progressing Goal: Knowledge of the prescribed therapeutic regimen will improve Outcome: Progressing   Problem: Activity: Goal: Interest or engagement in leisure activities will improve Outcome: Progressing Goal: Imbalance in normal sleep/wake cycle will improve Outcome: Progressing   Problem: Coping: Goal: Coping ability will improve Outcome: Progressing Goal: Will verbalize feelings Outcome: Progressing   Problem: Health Behavior/Discharge Planning: Goal: Ability to make decisions will improve Outcome: Progressing Goal: Compliance with therapeutic regimen will improve Outcome: Progressing   Problem: Role Relationship: Goal: Will demonstrate positive changes in social behaviors and relationships Outcome: Progressing   Problem: Education: Goal: Ability to state activities that reduce stress will improve Outcome: Progressing   Problem: Coping: Goal: Ability to identify and develop effective coping behavior will improve Outcome: Progressing   Problem: Self-Concept: Goal: Ability  to identify factors that promote anxiety will improve Outcome: Progressing Goal: Ability to modify response to factors that promote anxiety will improve Outcome: Progressing   Problem: Activity: Goal: Will identify at least one activity in which they can participate Outcome: Progressing   Problem: Coping: Goal: Ability to identify and develop effective coping behavior will improve Outcome: Progressing Goal: Ability to interact with others will improve Outcome: Progressing Goal: Demonstration of participation in decision-making regarding own care will improve Outcome: Progressing Goal: Ability to use eye contact when communicating with others will improve Outcome: Progressing   Problem: Health Behavior/Discharge Planning: Goal: Identification of resources available to assist in meeting health care needs will improve Outcome: Progressing

## 2019-01-09 NOTE — Progress Notes (Signed)
[facility] MD Progress Note  01/09/2019 4:04 PM Beckie Viscardi  MRN:  [id number] Subjective: Patient seen chart reviewed.  This is a patient with bipolar disorder and substance abuse.  Patient reports she is not feeling any better today.  She says she slept some last night but still woke up frequently because of her pain.  Her chief complaint remains her back pain.  She says however she does feel depressed and hopeless and has intermittent thoughts of suicide.  She is not attending groups and hardly ever leaves her room.  Does not appear to be psychotic.  Not having acute hallucinations.  She has been compliant with medication. Principal Problem: Bipolar I disorder, most recent episode depressed (HCC) Diagnosis: Principal Problem:   Bipolar I disorder, most recent episode depressed (HCC) Active Problems:   Amphetamine use disorder, severe (HCC)   Fibromyalgia  Total Time spent with patient: 30 minutes  Past Psychiatric History: Patient has a history of substance abuse and mood instability  Past Medical History:  Past Medical History:  Diagnosis Date  . Depression     Past Surgical History:  Procedure Laterality Date  . TUBAL LIGATION N/A 06/10/2017   Procedure: POST PARTUM TUBAL LIGATION;  Surgeon: Nadara Mustard, MD;  Location: [facility] ORS;  Service: Gynecology;  Laterality: N/A;   Family History: History reviewed. No pertinent family history. Family Psychiatric  History: See previous Social History:  Social History   Substance and Sexual Activity  Alcohol Use No     Social History   Substance and Sexual Activity  Drug Use Yes  . Types: Methamphetamines    Social History   Socioeconomic History  . Marital status: Single    Spouse name: Not on file  . Number of children: Not on file  . Years of education: Not on file  . Highest education level: Not on file  Occupational History  . Not on file  Social Needs  . Financial resource strain: Not on file  . Food insecurity:   Worry: Not on file    Inability: Not on file  . Transportation needs:    Medical: Not on file    Non-medical: Not on file  Tobacco Use  . Smoking status: Current Every Day Smoker    Packs/day: 0.50    Types: Cigarettes  . Smokeless tobacco: Never Used  Substance and Sexual Activity  . Alcohol use: No  . Drug use: Yes    Types: Methamphetamines  . Sexual activity: Yes    Birth control/protection: Surgical    Comment: planning tubal ligation  Lifestyle  . Physical activity:    Days per week: Not on file    Minutes per session: Not on file  . Stress: Not on file  Relationships  . Social connections:    Talks on phone: Not on file    Gets together: Not on file    Attends religious service: Not on file    Active member of club or organization: Not on file    Attends meetings of clubs or organizations: Not on file    Relationship status: Not on file  Other Topics Concern  . Not on file  Social History Narrative  . Not on file   Additional Social History:                         Sleep: Fair  Appetite:  Fair  Current Medications: Current Facility-Administered Medications  Medication Dose Route Frequency Provider Last Rate  Last Dose  . acetaminophen (TYLENOL) tablet 650 mg  650 mg Oral Q6H PRN He, Jun, MD   650 mg at 01/09/19 0819  . alum & mag hydroxide-simeth (MAALOX/MYLANTA) 200-200-20 MG/5ML suspension 30 mL  30 mL Oral Q4H PRN He, Jun, MD      . DULoxetine (CYMBALTA) DR capsule 20 mg  20 mg Oral Daily Asani Deniston, Jackquline Denmark, MD   20 mg at 01/09/19 0920  . eye wash ((SODIUM/POTASSIUM/SOD CHLORIDE)) ophthalmic solution 2 drop  2 drop Both Eyes PRN He, Jun, MD      . hydrOXYzine (ATARAX/VISTARIL) tablet 50 mg  50 mg Oral Q6H PRN Avielle Imbert, Jackquline Denmark, MD   50 mg at 01/09/19 0920  . ibuprofen (ADVIL) tablet 600 mg  600 mg Oral Q6H PRN Alexes Lamarque, Jackquline Denmark, MD   600 mg at 01/08/19 2158  . lithium carbonate capsule 600 mg  600 mg Oral QHS Matheson Vandehei, Jackquline Denmark, MD   600 mg at 01/08/19 2156   . magnesium hydroxide (MILK OF MAGNESIA) suspension 30 mL  30 mL Oral Daily PRN He, Jun, MD      . pregabalin (LYRICA) capsule 25 mg  25 mg Oral BID Kenady Doxtater, Jackquline Denmark, MD   25 mg at 01/09/19 0819  . QUEtiapine (SEROQUEL) tablet 200 mg  200 mg Oral QHS Giovanne Nickolson T, MD   200 mg at 01/08/19 2155  . traZODone (DESYREL) tablet 100 mg  100 mg Oral QHS PRN He, Jun, MD   100 mg at 01/08/19 2155    Lab Results: No results found for this or any previous visit (from the past 48 hour(s)).  Blood Alcohol level:  Lab Results  Component Value Date   ETH <10 01/06/2019    Metabolic Disorder Labs: No results found for: HGBA1C, MPG No results found for: PROLACTIN No results found for: CHOL, TRIG, HDL, CHOLHDL, VLDL, LDLCALC  Physical Findings: AIMS: Facial and Oral Movements Muscles of Facial Expression: None, normal Lips and Perioral Area: None, normal Jaw: None, normal Tongue: None, normal,Extremity Movements Upper (arms, wrists, hands, fingers): None, normal Lower (legs, knees, ankles, toes): None, normal, Trunk Movements Neck, shoulders, hips: None, normal, Overall Severity Severity of abnormal movements (highest score from questions above): None, normal Incapacitation due to abnormal movements: None, normal Patient's awareness of abnormal movements (rate only patient's report): No Awareness, Dental Status Current problems with teeth and/or dentures?: No Does patient usually wear dentures?: No  CIWA:  CIWA-Ar Total: 3 COWS:  COWS Total Score: 3  Musculoskeletal: Strength & Muscle Tone: within normal limits Gait & Station: normal Patient leans: N/A  Psychiatric Specialty Exam: Physical Exam  Nursing note and vitals reviewed. Constitutional: She appears well-developed and well-nourished.  HENT:  Head: Normocephalic and atraumatic.  Eyes: Pupils are equal, round, and reactive to light. Conjunctivae are normal.  Neck: Normal range of motion.  Cardiovascular: Regular rhythm and  normal heart sounds.  Respiratory: Effort normal.  GI: Soft.  Musculoskeletal: Normal range of motion.  Neurological: She is alert.  Skin: Skin is warm and dry.  Psychiatric: Her affect is blunt. Her speech is delayed. She is slowed and withdrawn. Cognition and memory are impaired. She expresses impulsivity. She exhibits a depressed mood. She expresses suicidal ideation. She expresses no suicidal plans.    Review of Systems  Constitutional: Negative.   HENT: Negative.   Eyes: Negative.   Respiratory: Negative.   Cardiovascular: Negative.   Gastrointestinal: Negative.   Musculoskeletal: Positive for back pain.  Skin: Negative.  Neurological: Negative.   Psychiatric/Behavioral: Positive for depression and suicidal ideas. Negative for hallucinations and substance abuse. The patient is nervous/anxious. The patient does not have insomnia.     Blood pressure 118/79, pulse 88, temperature 98 F (36.7 C), temperature source Oral, resp. rate 18, height 5\' 4"  (1.626 m), weight 77.1 kg, last menstrual period 12/23/2018, SpO2 100 %, unknown if currently breastfeeding.Body mass index is 29.18 kg/m.  General Appearance: Casual  Eye Contact:  Minimal  Speech:  Slow  Volume:  Decreased  Mood:  Depressed and Dysphoric  Affect:  Constricted  Thought Process:  Coherent  Orientation:  Full (Time, Place, and Person)  Thought Content:  Rumination and Tangential  Suicidal Thoughts:  Yes.  without intent/plan  Homicidal Thoughts:  No  Memory:  Immediate;   Fair Recent;   Fair Remote;   Fair  Judgement:  Impaired  Insight:  Shallow  Psychomotor Activity:  Decreased  Concentration:  Concentration: Poor  Recall:  Poor  Fund of Knowledge:  Fair  Language:  Fair  Akathisia:  No  Handed:  Right  AIMS (if indicated):     Assets:  Communication Skills Desire for Improvement  ADL's:  Impaired  Cognition:  WNL  Sleep:  Number of Hours: 7     Treatment Plan Summary: Daily contact with patient  to assess and evaluate symptoms and progress in treatment, Medication management and Plan Although she says that she does not feel any better today she looks like she is doing better.  She is not angry or irritable.  She is up out of her room wearing a full set of clothing.  Patient remains somatically focused.  I reassured her that gradually things should start getting better.  Patient will not be given any narcotics which is clearly 1 of the things she is trying to have happen.  We will continue current medication for depression and mood lability and pain.  Encourage patient to attend groups and get out of her room.  Mordecai RasmussenJohn Baxter Gonzalez, MD 01/09/2019, 4:04 PM

## 2019-01-09 NOTE — B[facility] Group Notes (Signed)
LCSW Group Therapy Note  01/09/2019 1:00 PM  Type of Therapy/Topic:  Group Therapy:  Emotion Regulation  Participation Level:  Did Not Attend   Description of Group:   The purpose of this group is to assist patients in learning to regulate negative emotions and experience positive emotions. Patients will be guided to discuss ways in which they have been vulnerable to their negative emotions. These vulnerabilities will be juxtaposed with experiences of positive emotions or situations, and patients will be challenged to use positive emotions to combat negative ones. Special emphasis will be placed on coping with negative emotions in conflict situations, and patients will process healthy conflict resolution skills.  Therapeutic Goals: 1. Patient will identify two positive emotions or experiences to reflect on in order to balance out negative emotions 2. Patient will label two or more emotions that they find the most difficult to experience 3. Patient will demonstrate positive conflict resolution skills through discussion and/or role plays  Summary of Patient Progress: X  Therapeutic Modalities:   Cognitive Behavioral Therapy Feelings Identification Dialectical Behavioral Therapy  Natasha Riddle, MSW, LCSW 01/09/2019 12:48 PM

## 2019-01-09 NOTE — Progress Notes (Signed)
Patient is alert and oriented x 4, affect is blunted , she appears irritable andp argumentative because writer advised her privacy issues in the medication which requires one patient at a time, she wasn't receptive and after her taking prescribed medication regimen she stormed out of the room. 15 minutes safety checks maintained will continue to monitor.

## 2019-01-09 NOTE — [facility] Suicide Risk Assessment (Signed)
[place] INPATIENT:  Family/Significant Other Suicide Prevention Education  Suicide Prevention Education:  Education Completed; Natasha Riddle, husband [phone]) has been identified by the patient as the family member/significant other with whom the patient will be residing, and identified as the person(s) who will aid the patient in the event of a mental health crisis (suicidal ideations/suicide attempt).  With written consent from the patient, the family member/significant other has been provided the following suicide prevention education, prior to the and/or following the discharge of the patient.  The suicide prevention education provided includes the following:  Suicide risk factors  Suicide prevention and interventions  National Suicide Hotline telephone number  [facility] assessment telephone number  [facility] Emergency Assistance [place] and/or Residential Mobile Crisis Unit telephone number  Request made of family/significant other to:  Remove weapons (e.g., guns, rifles, knives), all items previously/currently identified as safety concern.    Remove drugs/medications (over-the-counter, prescriptions, illicit drugs), all items previously/currently identified as a safety concern.  The family member/significant other verbalizes understanding of the suicide prevention education information provided.  The family member/significant other agrees to remove the items of safety concern listed above. Mr. Natasha Riddle reports he brought the pt to the hospital because of drug use and threatening to harm herself. He states the pt is negatively influenced by friends she met while incarcerated. He says she started using meth when she was released from jail. Per Mr. Natasha Riddle, the pt was incarcerated for stealing jewelry from his mother. He raises concern about the pt returning to the home stating he is concerned about leaving their child in her care due to the drug use. He  reports he was granted full custody of their child 3 weeks ago. In addition, he states she can return to their home if she is willing to get help. He denies pt having access to guns or weapons.  Natasha Riddle T Natasha Riddle 01/09/2019, 1:45 PM

## 2019-01-09 NOTE — Plan of Care (Signed)
Problem: Education: Goal: Knowledge of the prescribed therapeutic regimen will improve Outcome: Progressing Patient has knowledge of prescribed medication regimen.    

## 2019-01-09 NOTE — [facility] Group Notes (Signed)
[facility] Group Notes:  (Nursing/MHT/Case Management/Adjunct)  Date:  01/09/2019  Time:  9:44 PM  Type of Therapy:  Group Therapy  Participation Level:  Active  Participation Quality:  Appropriate  Affect:  Appropriate  Cognitive:  Alert  Insight:  Good  Engagement in Group:  Engaged  Modes of Intervention:  Activity  Summary of Progress/Problems:  Natasha Riddle 01/09/2019, 9:44 PM

## 2019-01-10 MED ORDER — PREGABALIN 25 MG PO CAPS: 25.0000 mg | ORAL_CAPSULE | Freq: Two times a day (BID) | ORAL | 1 refills | Status: DC

## 2019-01-10 MED ORDER — HYDROXYZINE HCL 50 MG PO TABS: 50.0000 mg | ORAL_TABLET | Freq: Four times a day (QID) | ORAL | 0 refills | Status: DC | PRN

## 2019-01-10 MED ORDER — METRONIDAZOLE 500 MG PO TABS
500.0000 mg | ORAL_TABLET | Freq: Two times a day (BID) | ORAL | Status: DC
  Administered 2019-01-10 – 2019-01-11 (×2): 500 mg via ORAL
  Filled 2019-01-10 (×3): qty 1

## 2019-01-10 MED ORDER — LITHIUM CARBONATE 600 MG PO CAPS: 600.0000 mg | ORAL_CAPSULE | Freq: Every day | ORAL | 0 refills | Status: DC

## 2019-01-10 MED ORDER — QUETIAPINE FUMARATE 200 MG PO TABS: 200.0000 mg | ORAL_TABLET | Freq: Every day | ORAL | 0 refills | Status: DC

## 2019-01-10 MED ORDER — DULOXETINE HCL 20 MG PO CPEP: 20.0000 mg | ORAL_CAPSULE | Freq: Every day | ORAL | 0 refills | Status: DC

## 2019-01-10 MED ORDER — DULOXETINE HCL 20 MG PO CPEP: 20.0000 mg | ORAL_CAPSULE | Freq: Every day | ORAL | 1 refills | Status: DC

## 2019-01-10 MED ORDER — METRONIDAZOLE 500 MG PO TABS: 500.0000 mg | ORAL_TABLET | Freq: Two times a day (BID) | ORAL | 0 refills | Status: DC

## 2019-01-10 MED ORDER — LITHIUM CARBONATE 600 MG PO CAPS: 600.0000 mg | ORAL_CAPSULE | Freq: Every day | ORAL | 1 refills | Status: DC

## 2019-01-10 MED ORDER — TRAZODONE HCL 100 MG PO TABS: 100.0000 mg | ORAL_TABLET | Freq: Every evening | ORAL | 0 refills | Status: DC | PRN

## 2019-01-10 MED ORDER — QUETIAPINE FUMARATE 200 MG PO TABS: 200.0000 mg | ORAL_TABLET | Freq: Every day | ORAL | 1 refills | Status: DC

## 2019-01-10 MED ORDER — TRAZODONE HCL 100 MG PO TABS: 100.0000 mg | ORAL_TABLET | Freq: Every evening | ORAL | 1 refills | Status: DC | PRN

## 2019-01-10 MED ORDER — HYDROXYZINE HCL 50 MG PO TABS: 50.0000 mg | ORAL_TABLET | Freq: Four times a day (QID) | ORAL | 1 refills | Status: DC | PRN

## 2019-01-10 NOTE — Plan of Care (Signed)
Patient compliant with medication administration per MD orders. Patient was isolative to her room other than coming out for snack and medications.   Problem: Activity: Goal: Interest or engagement in leisure activities will improve Outcome: Not Progressing   Problem: Health Behavior/Discharge Planning: Goal: Compliance with therapeutic regimen will improve Outcome: Progressing

## 2019-01-10 NOTE — Progress Notes (Signed)
D - Patient was in her room upon arrival to the unit. Patient was pleasant during assessment and medication administration. Patient denies SI/HI/AVH. Patient endorses anxiety and depression rating them 5/10. Patient stated that she had a good day and feels like she is improving but doesn't feel like she is ready to discharge yet.   A - Patient compliant with medication administration per MD orders and procedures on the unit. Patient given education. Patient given support and encouragement to be active in her treatment plan. Patient informed to let staff know if there are any issues or problems on the unit.   R - Patient being monitored Q 15 minutes for safety per unit protocol. Patient remains safe on the unit.

## 2019-01-10 NOTE — Plan of Care (Signed)
Patient is alert and oriented, denies SI, HI and AVH. Patient complains of depression 7/10, hopelessness 7/10 and anxiety 5/10. Patient complains of back pain 7/10 and received tylenol. Patient is anxious and blunted. Safety checks will continue Q 15 minutes. Problem: Education: Goal: Ability to make informed decisions regarding treatment will improve Outcome: Progressing   Problem: Coping: Goal: Coping ability will improve Outcome: Progressing   Problem: Health Behavior/Discharge Planning: Goal: Identification of resources available to assist in meeting health care needs will improve Outcome: Progressing   Problem: Medication: Goal: Compliance with prescribed medication regimen will improve Outcome: Progressing

## 2019-01-10 NOTE — BHH Group Notes (Signed)
LCSW Group Therapy Note  01/10/2019 11:54 AM  Type of Therapy/Topic:  Group Therapy:  Balance in Life  Participation Level:  Did Not Attend  Description of Group:    This group will address the concept of balance and how it feels and looks when one is unbalanced. Patients will be encouraged to process areas in their lives that are out of balance and identify reasons for remaining unbalanced. Facilitators will guide patients in utilizing problem-solving interventions to address and correct the stressor making their life unbalanced. Understanding and applying boundaries will be explored and addressed for obtaining and maintaining a balanced life. Patients will be encouraged to explore ways to assertively make their unbalanced needs known to significant others in their lives, using other group members and facilitator for support and feedback.  Therapeutic Goals: 1. Patient will identify two or more emotions or situations they have that consume much of in their lives. 2. Patient will identify signs/triggers that life has become out of balance:  3. Patient will identify two ways to set boundaries in order to achieve balance in their lives:  4. Patient will demonstrate ability to communicate their needs through discussion and/or role plays  Summary of Patient Progress:  x    Therapeutic Modalities:   Cognitive Behavioral Therapy Solution-Focused Therapy Assertiveness Training  Iris Pert, MSW, LCSW Clinical Social Work 01/10/2019 11:54 AM

## 2019-01-10 NOTE — B[facility] Group Notes (Signed)
[facility] Group Notes:  (Nursing/MHT/Case Management/Adjunct)  Date:  01/10/2019  Time:  2:31 PM  Type of Therapy:  Psychoeducational Skills  Participation Level:  Active  Participation Quality:  Appropriate, Attentive, Sharing and Supportive  Affect:  Appropriate and Excited  Cognitive:  Alert and Appropriate  Insight:  Appropriate, Good and Improving  Engagement in Group:  Developing/Improving and Supportive  Modes of Intervention:  Socialization and Support  Summary of Progress/Problems:  Natasha Riddle 01/10/2019, 2:31 PM

## 2019-01-10 NOTE — Progress Notes (Signed)
[facility]  01/10/2019 4:11 PM Natasha LemonsBrooke Mosey  MRN:  [id number] Subjective: Patient seen chart reviewed.  Patient reports she is feeling much better.  Not having any suicidal thoughts today.  Anxiety under better control.  Interacting with others without difficulty.  No longer complaining about her severe pain but is up and ambulating around the unit.  She does have a complaint of foul odor in the vaginal area and believes that she has bacterial vaginosis.  Patient says her mood and thinking have improved to the point where it would be reasonable to consider discharge tomorrow. Principal Problem: Bipolar I disorder, most recent episode depressed (HCC) Diagnosis: Principal Problem:   Bipolar I disorder, most recent episode depressed (HCC) Active Problems:   Amphetamine use disorder, severe (HCC)   Fibromyalgia  Total Time spent with patient: 30 minutes  Past Psychiatric History: Patient has a history of substance abuse mood instability chronic pain  Past Medical History:  Past Medical History:  Diagnosis Date  . Depression     Past Surgical History:  Procedure Laterality Date  . TUBAL LIGATION N/A 06/10/2017   Procedure: POST PARTUM TUBAL LIGATION;  Surgeon: Nadara MustardHarris, Robert P, MD;  Location: [facility] ORS;  Service: Gynecology;  Laterality: N/A;   Family History: History reviewed. No pertinent family history. Family Psychiatric  History: See previous Social History:  Social History   Substance and Sexual Activity  Alcohol Use No     Social History   Substance and Sexual Activity  Drug Use Yes  . Types: Methamphetamines    Social History   Socioeconomic History  . Marital status: Single    Spouse name: Not on file  . Number of children: Not on file  . Years of education: Not on file  . Highest education level: Not on file  Occupational History  . Not on file  Social Needs  . Financial resource strain: Not on file  . Food insecurity:    Worry: Not on  file    Inability: Not on file  . Transportation needs:    Medical: Not on file    Non-medical: Not on file  Tobacco Use  . Smoking status: Current Every Day Smoker    Packs/day: 0.50    Types: Cigarettes  . Smokeless tobacco: Never Used  Substance and Sexual Activity  . Alcohol use: No  . Drug use: Yes    Types: Methamphetamines  . Sexual activity: Yes    Birth control/protection: Surgical    Comment: planning tubal ligation  Lifestyle  . Physical activity:    Days per week: Not on file    Minutes per session: Not on file  . Stress: Not on file  Relationships  . Social connections:    Talks on phone: Not on file    Gets together: Not on file    Attends religious service: Not on file    Active member of club or organization: Not on file    Attends meetings of clubs or organizations: Not on file    Relationship status: Not on file  Other Topics Concern  . Not on file  Social History Narrative  . Not on file   Additional Social History:                         Sleep: Good  Appetite:  Fair  Current Medications: Current Facility-Administered Medications  Medication Dose Route Frequency Provider Last Rate Last Dose  . acetaminophen (  TYLENOL) tablet 650 mg  650 mg Oral Q6H PRN He, Jun, MD   650 mg at 01/10/19 0813  . alum & mag hydroxide-simeth (MAALOX/MYLANTA) 200-200-20 MG/5ML suspension 30 mL  30 mL Oral Q4H PRN He, Jun, MD      . DULoxetine (CYMBALTA) DR capsule 20 mg  20 mg Oral Daily Minnah Llamas, Jackquline Denmark, MD   20 mg at 01/10/19 0813  . eye wash ((SODIUM/POTASSIUM/SOD CHLORIDE)) ophthalmic solution 2 drop  2 drop Both Eyes PRN He, Jun, MD      . hydrOXYzine (ATARAX/VISTARIL) tablet 50 mg  50 mg Oral Q6H PRN Harnoor Reta, Jackquline Denmark, MD   50 mg at 01/10/19 0813  . ibuprofen (ADVIL) tablet 600 mg  600 mg Oral Q6H PRN Lavene Penagos, Jackquline Denmark, MD   600 mg at 01/09/19 2118  . lithium carbonate capsule 600 mg  600 mg Oral QHS Gracen Southwell, Jackquline Denmark, MD   600 mg at 01/09/19 2119  . magnesium  hydroxide (MILK OF MAGNESIA) suspension 30 mL  30 mL Oral Daily PRN He, Jun, MD      . metroNIDAZOLE (FLAGYL) tablet 500 mg  500 mg Oral Q12H Channing Savich T, MD      . pregabalin (LYRICA) capsule 25 mg  25 mg Oral BID Jakyron Fabro, Jackquline Denmark, MD   25 mg at 01/10/19 0813  . QUEtiapine (SEROQUEL) tablet 200 mg  200 mg Oral QHS Dagny Fiorentino T, MD   200 mg at 01/09/19 2118  . traZODone (DESYREL) tablet 100 mg  100 mg Oral QHS PRN He, Jun, MD   100 mg at 01/09/19 2118    Lab Results: No results found for this or any previous visit (from the past 48 hour(s)).  Blood Alcohol level:  Lab Results  Component Value Date   ETH <10 01/06/2019    Metabolic Disorder Labs: No results found for: HGBA1C, MPG No results found for: PROLACTIN No results found for: CHOL, TRIG, HDL, CHOLHDL, VLDL, LDLCALC  Physical Findings: AIMS: Facial and Oral Movements Muscles of Facial Expression: None, normal Lips and Perioral Area: None, normal Jaw: None, normal Tongue: None, normal,Extremity Movements Upper (arms, wrists, hands, fingers): None, normal Lower (legs, knees, ankles, toes): None, normal, Trunk Movements Neck, shoulders, hips: None, normal, Overall Severity Severity of abnormal movements (highest score from questions above): None, normal Incapacitation due to abnormal movements: None, normal Patient's awareness of abnormal movements (rate only patient's report): No Awareness, Dental Status Current problems with teeth and/or dentures?: No Does patient usually wear dentures?: No  CIWA:  CIWA-Ar Total: 3 COWS:  COWS Total Score: 3  Musculoskeletal: Strength & Muscle Tone: within normal limits Gait & Station: normal Patient leans: N/A  Psychiatric Specialty Exam: Physical Exam  Nursing note and vitals reviewed. Constitutional: She appears well-developed and well-nourished.  HENT:  Head: Normocephalic and atraumatic.  Eyes: Pupils are equal, round, and reactive to light. Conjunctivae are normal.   Neck: Normal range of motion.  Cardiovascular: Normal heart sounds.  Respiratory: Effort normal.  GI: Soft.  Musculoskeletal: Normal range of motion.  Neurological: She is alert.  Skin: Skin is warm and dry.  Psychiatric: She has a normal mood and affect. Her speech is normal and behavior is normal. Judgment and thought content normal. Cognition and memory are normal.    Review of Systems  Constitutional: Negative.   HENT: Negative.   Eyes: Negative.   Respiratory: Negative.   Cardiovascular: Negative.   Gastrointestinal: Negative.   Musculoskeletal: Negative.   Skin: Negative.  Neurological: Negative.   Psychiatric/Behavioral: Negative.     Blood pressure 123/76, pulse 80, temperature 98.3 F (36.8 C), temperature source Oral, resp. rate 18, height  (1.626 m), weight 77.1 kg, last menstrual period 12/23/2018, SpO2 100 %, unknown if currently breastfeeding.Body mass index is 29.18 kg/m.  General Appearance: Casual  Eye Contact:  Good  Speech:  Clear and Coherent  Volume:  Normal  Mood:  Euthymic  Affect:  Congruent  Thought Process:  Goal Directed  Orientation:  Full (Time, Place, and Person)  Thought Content:  Logical  Suicidal Thoughts:  No  Homicidal Thoughts:  No  Memory:  Immediate;   Fair Recent;   Fair Remote;   Fair  Judgement:  Fair  Insight:  Fair  Psychomotor Activity:  Normal  Concentration:  Concentration: Fair  Recall:  [employer] of Knowledge:  Fair  Language:  Fair  Akathisia:  No  Handed:  Right  AIMS (if indicated):     Assets:  Desire for Improvement Housing Physical Health Resilience Social Support  ADL's:  Intact  Cognition:  WNL  Sleep:  Number of Hours: 8     Treatment Plan Summary: Daily contact with patient to assess and evaluate symptoms and progress in treatment, Medication management and Plan Patient appears to be doing much better.  May have gotten over what ever withdrawal.  She had.  Mood is more upbeat.  Does not  appear to be acutely dangerous.  I agreed to the plan for discharge tomorrow.  We will make preparations for a 7-day supply and then referral to outpatient treatment in the community.  I have gone ahead and prescribed Flagyl for her self diagnosed bacterial vaginosis.  Mordecai Rasmussen, MD 01/10/2019, 4:11 PM

## 2019-01-10 NOTE — [facility] Suicide Risk Assessment (Signed)
[facility] Discharge Suicide Risk Assessment   Principal Problem: Bipolar I disorder, most recent episode depressed (HCC) Discharge Diagnoses: Principal Problem:   Bipolar I disorder, most recent episode depressed (HCC) Active Problems:   Amphetamine use disorder, severe (HCC)   Fibromyalgia   Total Time spent with patient: 30 minutes  Musculoskeletal: Strength & Muscle Tone: within normal limits Gait & Station: normal Patient leans: N/A  Psychiatric Specialty Exam: Review of Systems  Constitutional: Negative.   HENT: Negative.   Eyes: Negative.   Respiratory: Negative.   Cardiovascular: Negative.   Gastrointestinal: Negative.   Musculoskeletal: Negative.   Skin: Negative.   Neurological: Negative.   Psychiatric/Behavioral: Negative.     Blood pressure 123/76, pulse 80, temperature 98.3 F (36.8 C), temperature source Oral, resp. rate 18, height 5\' 4"  (1.626 m), weight 77.1 kg, last menstrual period 12/23/2018, SpO2 100 %, unknown if currently breastfeeding.Body mass index is 29.18 kg/m.  General Appearance: Casual  Eye Contact::  Fair  Speech:  Clear and Coherent409  Volume:  Normal  Mood:  Euthymic  Affect:  Congruent  Thought Process:  Goal Directed  Orientation:  Full (Time, Place, and Person)  Thought Content:  Logical  Suicidal Thoughts:  No  Homicidal Thoughts:  No  Memory:  Immediate;   Fair Recent;   Fair Remote;   Fair  Judgement:  Fair  Insight:  Fair  Psychomotor Activity:  Normal  Concentration:  Fair  Recall:  [employer] of Knowledge:Fair  Language: Fair  Akathisia:  No  Handed:  Right  AIMS (if indicated):     Assets:  Desire for Improvement Housing Physical Health Resilience  Sleep:  Number of Hours: 8  Cognition: WNL  ADL's:  Intact   Mental Status Per Nursing Assessment::   On Admission:  NA  Demographic Factors:  Low socioeconomic status  Loss Factors: Financial problems/change in socioeconomic status  Historical  Factors: Impulsivity  Risk Reduction Factors:   Responsible for children under 20 years of age, Sense of responsibility to family and Religious beliefs about death  Continued Clinical Symptoms:  Bipolar Disorder:   Mixed State Alcohol/Substance Abuse/Dependencies  Cognitive Features That Contribute To Risk:  None    Suicide Risk:  Minimal: No identifiable suicidal ideation.  Patients presenting with no risk factors but with morbid ruminations; may be classified as minimal risk based on the severity of the depressive symptoms  Follow-up Information    [employer] Go on 01/15/2019.   Why:  Please follow up at [facility] on Tuesday, Jan 15, 2019 at 8am. You are scheduled for a zoom visit. Thank you. Contact information: [address] [place] [address] [phone]           Plan Of Care/Follow-up recommendations:  Activity:  Activity as tolerated Diet:  Regular diet Other:  Follow-up with outpatient treatment at [facility]  Mordecai Rasmussen, MD 01/10/2019, 4:14 PM

## 2019-01-10 NOTE — Progress Notes (Signed)
Recreation Therapy Notes  Date: 01/10/2019   Time: 9:30 am   Location: Craft room   Behavioral response: N/A   Intervention Topic: Decision Making  Discussion/Intervention: Patient did not attend group.   Clinical Observations/Feedback:  Patient did not attend group.   Navada Osterhout LRT/CTRS          Kerrington Sova 01/10/2019 10:38 AM

## 2019-01-11 LAB — LITHIUM LEVEL: Lithium Lvl: 0.34 mmol/L — ABNORMAL LOW (ref 0.60–1.20)

## 2019-01-11 NOTE — Discharge Summary (Signed)
Physician Discharge Summary Note  Patient:  Natasha Riddle is an 33 y.o., female MRN:  [id number] DOB:  1985-10-26 Patient phone:  [phone] (home)  Patient address:   [address] [place] [address],  Total Time spent with patient: 45 minutes  Date of Admission:  01/07/2019 Date of Discharge: Jan 11, 2019  Reason for Admission: Admitted through the emergency room because of reports of suicidal ideation and substance abuse  Principal Problem: Bipolar I disorder, most recent episode depressed [facility]) Discharge Diagnoses: Principal Problem:   Bipolar I disorder, most recent episode depressed (HCC) Active Problems:   Amphetamine use disorder, severe (HCC)   Fibromyalgia   Past Psychiatric History: Past history of ongoing substance abuse bipolar disorder episodes of depression and agitation  Past Medical History:  Past Medical History:  Diagnosis Date  . Depression     Past Surgical History:  Procedure Laterality Date  . TUBAL LIGATION N/A 06/10/2017   Procedure: POST PARTUM TUBAL LIGATION;  Surgeon: Nadara Mustard, MD;  Location: [facility] ORS;  Service: Gynecology;  Laterality: N/A;   Family History: History reviewed. No pertinent family history. Family Psychiatric  History: Mood history Social History:  Social History   Substance and Sexual Activity  Alcohol Use No     Social History   Substance and Sexual Activity  Drug Use Yes  . Types: Methamphetamines    Social History   Socioeconomic History  . Marital status: Single    Spouse name: Not on file  . Number of children: Not on file  . Years of education: Not on file  . Highest education level: Not on file  Occupational History  . Not on file  Social Needs  . Financial resource strain: Not on file  . Food insecurity:    Worry: Not on file    Inability: Not on file  . Transportation needs:    Medical: Not on file    Non-medical: Not on file  Tobacco Use  . Smoking status: Current Every Day Smoker     Packs/day: 0.50    Types: Cigarettes  . Smokeless tobacco: Never Used  Substance and Sexual Activity  . Alcohol use: No  . Drug use: Yes    Types: Methamphetamines  . Sexual activity: Yes    Birth control/protection: Surgical    Comment: planning tubal ligation  Lifestyle  . Physical activity:    Days per week: Not on file    Minutes per session: Not on file  . Stress: Not on file  Relationships  . Social connections:    Talks on phone: Not on file    Gets together: Not on file    Attends religious service: Not on file    Active member of club or organization: Not on file    Attends meetings of clubs or organizations: Not on file    Relationship status: Not on file  Other Topics Concern  . Not on file  Social History Narrative  . Not on file    Hospital Course: Patient admitted to the psychiatric ward.  15-minute checks in place.  Patient did not display dangerous violent or suicidal behavior during her hospitalization.  She stayed in bed at first for a day or so very focused on somatic complaints but eventually got over her complaints about back pain and was able to participate appropriately in groups and activities on the unit.  Patient was prescribed medication for what appears to be most likely fibromyalgia as well as for  mood instability.  Started low-dose lithium and tolerated that as well.  Patient by the time she was being discharged denied any suicidal thoughts at all.  Affect and mood were upbeat.  Patient has been referred for follow-up treatment in the community at [facility] and agrees to follow-up and to work on substance abuse sobriety  Physical Findings: AIMS: Facial and Oral Movements Muscles of Facial Expression: None, normal Lips and Perioral Area: None, normal Jaw: None, normal Tongue: None, normal,Extremity Movements Upper (arms, wrists, hands, fingers): None, normal Lower (legs, knees, ankles, toes): None, normal, Trunk Movements Neck, shoulders, hips: None,  normal, Overall Severity Severity of abnormal movements (highest score from questions above): None, normal Incapacitation due to abnormal movements: None, normal Patient's awareness of abnormal movements (rate only patient's report): No Awareness, Dental Status Current problems with teeth and/or dentures?: No Does patient usually wear dentures?: No  CIWA:  CIWA-Ar Total: 3 COWS:  COWS Total Score: 3  Musculoskeletal: Strength & Muscle Tone: within normal limits Gait & Station: normal Patient leans: N/A  Psychiatric Specialty Exam: Physical Exam  Nursing note and vitals reviewed. Constitutional: She appears well-developed and well-nourished.  HENT:  Head: Normocephalic and atraumatic.  Eyes: Pupils are equal, round, and reactive to light. Conjunctivae are normal.  Neck: Normal range of motion.  Cardiovascular: Regular rhythm and normal heart sounds.  Respiratory: Effort normal. No respiratory distress.  GI: Soft.  Musculoskeletal: Normal range of motion.  Neurological: She is alert.  Skin: Skin is warm and dry.  Psychiatric: She has a normal mood and affect. Her behavior is normal. Judgment and thought content normal.    Review of Systems  Constitutional: Negative.   HENT: Negative.   Eyes: Negative.   Respiratory: Negative.   Cardiovascular: Negative.   Gastrointestinal: Negative.   Musculoskeletal: Positive for back pain.  Skin: Negative.   Neurological: Negative.   Psychiatric/Behavioral: Negative.     Blood pressure 112/70, pulse 79, temperature 98.1 F (36.7 C), temperature source Oral, resp. rate 18, height 5\' 4"  (1.626 m), weight 77.1 kg, last menstrual period 12/23/2018, SpO2 100 %, unknown if currently breastfeeding.Body mass index is 29.18 kg/m.  General Appearance: Casual  Eye Contact:  Good  Speech:  Clear and Coherent  Volume:  Normal  Mood:  Euthymic  Affect:  Congruent  Thought Process:  Goal Directed  Orientation:  Full (Time, Place, and Person)   Thought Content:  Logical  Suicidal Thoughts:  No  Homicidal Thoughts:  No  Memory:  Immediate;   Fair Recent;   Fair Remote;   Fair  Judgement:  Fair  Insight:  Fair  Psychomotor Activity:  Decreased  Concentration:  Concentration: Fair  Recall:  [employer] of Knowledge:  Fair  Language:  Fair  Akathisia:  No  Handed:  Right  AIMS (if indicated):     Assets:  Desire for Improvement Housing Physical Health Resilience  ADL's:  Intact  Cognition:  WNL  Sleep:  Number of Hours: 8     Have you used any form of tobacco in the last 30 days? (Cigarettes, Smokeless Tobacco, Cigars, and/or Pipes): Yes  Has this patient used any form of tobacco in the last 30 days? (Cigarettes, Smokeless Tobacco, Cigars, and/or Pipes) Yes, Yes, A prescription for an FDA-approved tobacco cessation medication was offered at discharge and the patient refused  Blood Alcohol level:  Lab Results  Component Value Date   [facility] <10 01/06/2019    Metabolic Disorder Labs:  No results found for:  HGBA1C, MPG No results found for: PROLACTIN No results found for: CHOL, TRIG, HDL, CHOLHDL, VLDL, LDLCALC  See Psychiatric Specialty Exam and Suicide Risk Assessment completed by Attending Physician prior to discharge.  Discharge destination:  Home  Is patient on multiple antipsychotic therapies at discharge:  No   Has Patient had three or more failed trials of antipsychotic monotherapy by history:  No  Recommended Plan for Multiple Antipsychotic Therapies: NA  Discharge Instructions    Diet - low sodium heart healthy   Complete by:  As directed    Increase activity slowly   Complete by:  As directed      Allergies as of 01/11/2019   Not on File     Medication List    STOP taking these medications   clonazePAM 1 MG tablet Commonly known as:  KLONOPIN   gabapentin 400 MG capsule Commonly known as:  NEURONTIN   HYDROcodone-acetaminophen 5-325 MG tablet Commonly known as:  NORCO/VICODIN      TAKE these medications     Indication  DULoxetine 20 MG capsule Commonly known as:  CYMBALTA Take 1 capsule (20 mg total) by mouth daily.  Indication:  Fibromyalgia Syndrome, Generalized Anxiety Disorder   hydrOXYzine 50 MG tablet Commonly known as:  ATARAX/VISTARIL Take 1 tablet (50 mg total) by mouth every 6 (six) hours as needed for anxiety.  Indication:  Feeling Anxious   lithium 600 MG capsule Take 1 capsule (600 mg total) by mouth at bedtime.  Indication:  Depression   metroNIDAZOLE 500 MG tablet Commonly known as:  FLAGYL Take 1 tablet (500 mg total) by mouth every 12 (twelve) hours.  Indication:  Vaginosis caused by Bacteria   pregabalin 25 MG capsule Commonly known as:  LYRICA Take 1 capsule (25 mg total) by mouth 2 (two) times daily.  Indication:  Fibromyalgia Syndrome   QUEtiapine 200 MG tablet Commonly known as:  SEROQUEL Take 1 tablet (200 mg total) by mouth at bedtime.  Indication:  Depressive Phase of Manic-Depression   traZODone 100 MG tablet Commonly known as:  DESYREL Take 1 tablet (100 mg total) by mouth at bedtime as needed for sleep.  Indication:  Trouble Sleeping      Follow-up Information    [facility] Go on 01/15/2019.   Why:  [facility] Tuesday, Jan 15, 2019 at 8am. You are scheduled for a zoom visit. Thank you. Contact information: [address] [place] [id number] [phone]           Follow-up recommendations:  Activity:  Activity as tolerated Diet:  Regular diet Other:  Follow-up with [facility] for substance abuse and mental health treatment.  Continue current medication.  Comments: Risks and benefits of medication described.  Patient tolerated medication well without any side effects.  Doses were Modest during her hospital stay because of her reported history of side effects.  Patient is [facility].  Signed: Mordecai RasmussenJohn Dale Strausser, MD 01/11/2019, 12:27 PM

## 2019-01-11 NOTE — Progress Notes (Addendum)
Patient alert and oriented x 4, affect is blunted but she brightens upon approach, no distress noted, her mood is less irritable and she is receptive to staff. Patient's thoughts are organized and coherent, interacting appropriately with peers and staff, compliant with prescribed medication regimen, patient denies SI/HI/AVH 15 minutes safety checks maintained will continue to monitor

## 2019-01-11 NOTE — Progress Notes (Signed)
Recreation Therapy Notes   Date: 01/11/2019  Time: 9:30 am  Location: Craft room  Behavioral response: Appropriate   Intervention Topic: Relaxation   Discussion/Intervention:  Group content today was focused on relaxation. The group defined relaxation and identified healthy ways to relax. Individuals expressed how much time they spend relaxing. Patients expressed how much their life would be if they did not make time for themselves to relax. The group stated ways they could improve their relaxation techniques in the future.  Individuals participated in the intervention "Time to Relax" where they had a chance to experience different relaxation techniques.   Clinical Observations/Feedback:  Patient came to group and defined relaxation as being in a comfy bed when it is raining. She stated she likes to listen to music when she relaxes. Individual was social with staff while participating in the intervention. Natasha Riddle LRT/CTRS         Natasha Riddle 01/11/2019 12:11 PM

## 2019-01-11 NOTE — Progress Notes (Signed)
Recreation Therapy Notes  INPATIENT RECREATION TR PLAN  Patient Details Name: Natasha Riddle MRN: [id number] DOB: 1985-11-21 Today's Date: 01/11/2019  Rec Therapy Plan Is patient appropriate for Therapeutic Recreation?: Yes Treatment times per week: at least 3 Estimated Length of Stay: 5-7 days TR Treatment/Interventions: Group participation (Comment)  Discharge Criteria Pt will be discharged from therapy if:: Discharged Treatment plan/goals/alternatives discussed and agreed upon by:: Patient/family  Discharge Summary Short term goals set: Patient will identify 3 positive coping skills strategies to use post d/c within 5 recreation therapy group sessions Short term goals met: Not met Progress toward goals comments: Groups attended Which groups?: Other (Comment)(Relaxation ) Reason goals not met: Patient spent most of her time in her room  Therapeutic equipment acquired: N/A Reason patient discharged from therapy: Discharge from hospital Pt/family agrees with progress & goals achieved: Yes Date patient discharged from therapy: 01/11/19   Noami Bove 01/11/2019, 1:48 PM

## 2019-01-11 NOTE — Progress Notes (Signed)
Patient denies SI/HI, denies A/V hallucinations. Patient verbalizes understanding of discharge instructions, follow up care and prescriptions.7 days medicines given to patient. Patient given all belongings from [facility] locker. Patient escorted out by staff, transported by family. 

## 2019-01-11 NOTE — Progress Notes (Signed)
  [facility] Adult Case Management Discharge Plan :  Will you be returning to the same living situation after discharge:  Yes,  lives with spouse and children At discharge, do you have transportation home?: Yes,  pt reports spouse will pick up at 1pm Do you have the ability to pay for your medications: Yes,  insurance  Release of information consent forms completed and in the chart;  Patient's signature needed at discharge.  Patient to Follow up at: Follow-up Information    [facility] Go on 01/15/2019.   Why:  Please follow up at [facility] on Tuesday, Jan 15, 2019 at 8am. You are scheduled for a zoom visit. Thank you. Contact information: [address] [place] [address] [phone]           Next level of care provider has access to [facility] Link:no  Safety Planning and Suicide Prevention discussed: Yes,  Ok Edwards, husband  Have you used any form of tobacco in the last 30 days? (Cigarettes, Smokeless Tobacco, Cigars, and/or Pipes): Yes  Has patient been referred to the Quitline?: N/A patient is not a smoker  Patient has been referred for addiction treatment: N/A  Suzan Slick, LCSW 01/11/2019, 9:26 AM

## 2019-01-11 NOTE — Plan of Care (Signed)
  Problem: Education: Goal: Utilization of techniques to improve thought processes will improve Outcome: Progressing  Patient has insight  improved  thought process about her treatment plan

## 2019-01-11 NOTE — Tx Team (Signed)
Interdisciplinary Treatment and Diagnostic Plan Update  01/11/2019 Time of Session: 830am Natasha Riddle MRN: [id number]  Principal Diagnosis: Bipolar I disorder, most recent episode depressed (HCC)  Secondary Diagnoses: Principal Problem:   Bipolar I disorder, most recent episode depressed (HCC) Active Problems:   Amphetamine use disorder, severe (HCC)   Fibromyalgia   Current Medications:  Current Facility-Administered Medications  Medication Dose Route Frequency Provider Last Rate Last Dose   acetaminophen (TYLENOL) tablet 650 mg  650 mg Oral Q6H PRN He, Jun, MD   650 mg at 01/10/19 0813   alum & mag hydroxide-simeth (MAALOX/MYLANTA) 200-200-20 MG/5ML suspension 30 mL  30 mL Oral Q4H PRN He, Jun, MD       DULoxetine (CYMBALTA) DR capsule 20 mg  20 mg Oral Daily Clapacs, Jackquline Denmark, MD   20 mg at 01/11/19 0801   eye wash ((SODIUM/POTASSIUM/SOD CHLORIDE)) ophthalmic solution 2 drop  2 drop Both Eyes PRN He, Jun, MD       hydrOXYzine (ATARAX/VISTARIL) tablet 50 mg  50 mg Oral Q6H PRN Clapacs, John T, MD   50 mg at 01/11/19 0800   ibuprofen (ADVIL) tablet 600 mg  600 mg Oral Q6H PRN Clapacs, John T, MD   600 mg at 01/11/19 0800   lithium carbonate capsule 600 mg  600 mg Oral QHS Clapacs, John T, MD   600 mg at 01/10/19 2127   magnesium hydroxide (MILK OF MAGNESIA) suspension 30 mL  30 mL Oral Daily PRN He, Jun, MD       metroNIDAZOLE (FLAGYL) tablet 500 mg  500 mg Oral Q12H Clapacs, John T, MD   500 mg at 01/11/19 0630   pregabalin (LYRICA) capsule 25 mg  25 mg Oral BID Clapacs, John T, MD   25 mg at 01/11/19 0801   QUEtiapine (SEROQUEL) tablet 200 mg  200 mg Oral QHS Clapacs, John T, MD   200 mg at 01/10/19 2127   traZODone (DESYREL) tablet 100 mg  100 mg Oral QHS PRN He, Jun, MD   100 mg at 01/10/19 2127   PTA Medications: Medications Prior to Admission  Medication Sig Dispense Refill Last Dose   clonazePAM (KLONOPIN) 1 MG tablet Take 1 mg by mouth at bedtime as needed for  anxiety (sleep).   Unknown at PRN   gabapentin (NEURONTIN) 400 MG capsule Take 400 mg by mouth 3 (three) times daily as needed (nerve pain).   Past Week at Unknown time   HYDROcodone-acetaminophen (NORCO/VICODIN) 5-325 MG tablet Take 1 tablet by mouth 2 (two) times daily as needed for moderate pain or severe pain.   Unknown at PRN   [DISCONTINUED] QUEtiapine (SEROQUEL) 200 MG tablet Take 200 mg by mouth at bedtime.   Past Week at Unknown time    Patient Stressors: Financial difficulties Health problems Marital or family conflict Substance abuse  Patient Strengths: Average or above average intelligence Capable of independent living Manufacturing systems engineer Motivation for treatment/growth Supportive family/friends  Treatment Modalities: Medication Management, Group therapy, Case management,  1 to 1 session with clinician, Psychoeducation, Recreational therapy.   Physician Treatment Plan for Primary Diagnosis: Bipolar I disorder, most recent episode depressed (HCC) Long Term Goal(s): Improvement in symptoms so as ready for discharge Improvement in symptoms so as ready for discharge   Short Term Goals: Ability to verbalize feelings will improve Ability to disclose and discuss suicidal ideas Ability to demonstrate self-control will improve Ability to identify triggers associated with substance abuse/mental health issues will improve  Medication Management: Evaluate patient's  response, side effects, and tolerance of medication regimen.  Therapeutic Interventions: 1 to 1 sessions, Unit Group sessions and Medication administration.  Evaluation of Outcomes: Progressing  Physician Treatment Plan for Secondary Diagnosis: Principal Problem:   Bipolar I disorder, most recent episode depressed (HCC) Active Problems:   Amphetamine use disorder, severe (HCC)   Fibromyalgia  Long Term Goal(s): Improvement in symptoms so as ready for discharge Improvement in symptoms so as ready for discharge    Short Term Goals: Ability to verbalize feelings will improve Ability to disclose and discuss suicidal ideas Ability to demonstrate self-control will improve Ability to identify triggers associated with substance abuse/mental health issues will improve     Medication Management: Evaluate patient's response, side effects, and tolerance of medication regimen.  Therapeutic Interventions: 1 to 1 sessions, Unit Group sessions and Medication administration.  Evaluation of Outcomes: Progressing   RN Treatment Plan for Primary Diagnosis: Bipolar I disorder, most recent episode depressed (HCC) Long Term Goal(s): Knowledge of disease and therapeutic regimen to maintain health will improve  Short Term Goals: Ability to participate in decision making will improve, Ability to verbalize feelings will improve, Ability to disclose and discuss suicidal ideas, Ability to identify and develop effective coping behaviors will improve and Compliance with prescribed medications will improve  Medication Management: RN will administer medications as ordered by provider, will assess and evaluate patient's response and provide education to patient for prescribed medication. RN will report any adverse and/or side effects to prescribing provider.  Therapeutic Interventions: 1 on 1 counseling sessions, Psychoeducation, Medication administration, Evaluate responses to treatment, Monitor vital signs and CBGs as ordered, Perform/monitor CIWA, COWS, AIMS and Fall Risk screenings as ordered, Perform wound care treatments as ordered.  Evaluation of Outcomes: Progressing   LCSW Treatment Plan for Primary Diagnosis: Bipolar I disorder, most recent episode depressed (HCC) Long Term Goal(s): Safe transition to appropriate next level of care at discharge, Engage patient in therapeutic group addressing interpersonal concerns.  Short Term Goals: Engage patient in aftercare planning with referrals and resources  Therapeutic  Interventions: Assess for all discharge needs, 1 to 1 time with Social worker, Explore available resources and support systems, Assess for adequacy in community support network, Educate family and significant other(s) on suicide prevention, Complete Psychosocial Assessment, Interpersonal group therapy.  Evaluation of Outcomes: Progressing   Progress in Treatment: Attending groups: No. Participating in groups: No. Taking medication as prescribed: Yes. Toleration medication: Yes. Family/Significant other contact made: Yes, individual(s) contacted:  Ok Edwards, husband Patient understands diagnosis: Yes. Discussing patient identified problems/goals with staff: Yes. Medical problems stabilized or resolved: Yes. Denies suicidal/homicidal ideation: Yes. Issues/concerns per patient self-inventory: No. Other: N/A  New problem(s) identified: No, Describe:  none reported  New Short Term/Long Term Goal(s):Attend outpatient treatment, take medication as prescribed, develop and implement healthy coping methods to manage stressors.  Patient Goals:  "To get better"  Discharge Plan or Barriers: Pt will return home and follow up at [facility] on 01/15/19.  Reason for Continuation of Hospitalization: Pt d/c on 01/11/19  Estimated Length of Stay: Pt d/c on 01/11/19    Attendees: Patient: 01/11/2019 9:47 AM  Physician: Mordecai Rasmussen 01/11/2019 9:47 AM  Nursing: Mickeal Needy Ravenell 01/11/2019 9:47 AM  RN Care Manager: 01/11/2019 9:47 AM  Social Worker: Lady Gary Moton  01/11/2019 9:47 AM  Recreational Therapist:  01/11/2019 9:47 AM  Other:  01/11/2019 9:47 AM  Other:  01/11/2019 9:47 AM  Other: 01/11/2019 9:47 AM    Scribe for Treatment Team: Ebony Hail  Philip Aspen Joscelin Fray, LCSW 01/11/2019 9:47 AM

## 2019-01-24 ENCOUNTER — Encounter: Payer: Self-pay | Admitting: Intensive Care

## 2019-01-24 ENCOUNTER — Emergency Department
Admission: EM | Admit: 2019-01-24 | Discharge: 2019-01-24 | Disposition: A | Discharge status: Home / Self Care | Payer: Medicaid Other | Attending: Emergency Medicine | Admitting: Emergency Medicine

## 2019-01-24 ENCOUNTER — Emergency Department: Payer: Medicaid Other

## 2019-01-24 ENCOUNTER — Other Ambulatory Visit: Payer: Self-pay

## 2019-01-24 DIAGNOSIS — Y9384 Activity, sleeping: Secondary | ICD-10-CM | POA: Insufficient documentation

## 2019-01-24 DIAGNOSIS — F17210 Nicotine dependence, cigarettes, uncomplicated: Secondary | ICD-10-CM | POA: Insufficient documentation

## 2019-01-24 DIAGNOSIS — M25552 Pain in left hip: Secondary | ICD-10-CM | POA: Diagnosis present

## 2019-01-24 DIAGNOSIS — M7062 Trochanteric bursitis, left hip: Secondary | ICD-10-CM | POA: Diagnosis not present

## 2019-01-24 DIAGNOSIS — Z79899 Other long term (current) drug therapy: Secondary | ICD-10-CM | POA: Diagnosis not present

## 2019-01-24 MED ORDER — IBUPROFEN 400 MG PO TABS
600.0000 mg | ORAL_TABLET | Freq: Once | ORAL | Status: AC
  Administered 2019-01-24: 600 mg via ORAL
  Filled 2019-01-24: qty 2

## 2019-01-24 MED ORDER — PREDNISONE 10 MG (21) PO TBPK: ORAL_TABLET | ORAL | 0 refills | Status: DC

## 2019-01-24 NOTE — ED Notes (Addendum)
Arrives with C/O left hip pain.  Denies injury.  States woke up this morning with pain to left hip.  Pain worse with weight bearing.

## 2019-01-24 NOTE — ED Triage Notes (Addendum)
Patient c/o Left leg pain that starts at hip and radiates down since this AM. Hx drug abuse (meth)

## 2019-01-24 NOTE — Discharge Instructions (Addendum)
Apply ice to the left hip.  Take medication as prescribed.  You may also take Tylenol as needed.  Follow-up with orthopedics if not better in 5 to 7 days.  Return emergency department worsening.

## 2019-01-24 NOTE — ED Notes (Signed)
AAOx3.  Skin warm and dry.  NAD 

## 2019-01-24 NOTE — ED Provider Notes (Signed)
[facility] Emergency Department Provider Note  ____________________________________________   None    (approximate)  I have reviewed the triage vital signs and the nursing notes.   HISTORY  Chief Complaint Leg Pain (left side)    HPI Natasha Riddle is a 33 y.o. female presents emergency department complaining of left hip pain.  Patient states she woke up this morning with hip pain.  No known injury.  No recent IV drug use.  She states she is very drowsy due to her Seroquel.    Past Medical History:  Diagnosis Date  . Depression     Patient Active Problem List   Diagnosis Date Noted  . 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 Fibromyalgia 01/07/2019  . Bipolar I disorder, most recent episode depressed (HCC) 01/07/2019  . Admission for sterilization 06/10/2017  . Post term pregnancy 06/08/2017  . Abnormal fetal heart rate 05/17/2017  . Labor and delivery, indication for care 05/15/2017  . Indication for care in labor and delivery, antepartum 04/26/2017    Past Surgical History:  Procedure Laterality Date  . TUBAL LIGATION N/A 06/10/2017   Procedure: POST PARTUM TUBAL LIGATION;  Surgeon: Nadara MustardHarris, Robert P, MD;  Location: [facility] ORS;  Service: Gynecology;  Laterality: N/A;    Prior to Admission medications   Medication Sig Start Date End Date Taking? Authorizing Provider  DULoxetine (CYMBALTA) 20 MG capsule Take 1 capsule (20 mg total) by mouth daily. 01/11/19   Clapacs, Jackquline DenmarkJohn T, MD  hydrOXYzine (ATARAX/VISTARIL) 50 MG tablet Take 1 tablet (50 mg total) by mouth every 6 (six) hours as needed for anxiety. 01/10/19   Clapacs, Jackquline DenmarkJohn T, MD  lithium 600 MG capsule Take 1 capsule (600 mg total) by mouth at bedtime. 01/10/19   Clapacs, Jackquline DenmarkJohn T, MD  metroNIDAZOLE (FLAGYL) 500 MG tablet Take 1 tablet (500 mg total) by mouth every 12 (twelve) hours. 01/10/19   Clapacs, Jackquline DenmarkJohn T, MD  predniSONE (STERAPRED UNI-PAK 21 TAB) 10 MG (21) TBPK tablet Take 6 pills  on day one then decrease by 1 pill each day 01/24/19   Faythe GheeFisher, Susan W, PA-C  pregabalin (LYRICA) 25 MG capsule Take 1 capsule (25 mg total) by mouth 2 (two) times daily. 01/10/19   Clapacs, Jackquline DenmarkJohn T, MD  QUEtiapine (SEROQUEL) 200 MG tablet Take 1 tablet (200 mg total) by mouth at bedtime. 01/10/19   Clapacs, Jackquline DenmarkJohn T, MD  traZODone (DESYREL) 100 MG tablet Take 1 tablet (100 mg total) by mouth at bedtime as needed for sleep. 01/10/19   Clapacs, Jackquline DenmarkJohn T, MD    Allergies Patient has no known allergies.  History reviewed. No pertinent family history.  Social History Social History   Tobacco Use  . Smoking status: Current Every Day Smoker    Packs/day: 0.50    Types: Cigarettes  . Smokeless tobacco: Never Used  Substance Use Topics  . Alcohol use: No  . Drug use: Yes    Types: Methamphetamines    Review of Systems  Constitutional: No fever/chills Eyes: No visual changes. ENT: No sore throat. Respiratory: Denies cough Genitourinary: Negative for dysuria. Musculoskeletal: Negative for back pain.  Left hip pain Skin: Negative for rash.    ____________________________________________   PHYSICAL EXAM:  VITAL SIGNS: ED Triage Vitals  Enc Vitals Group     BP 01/24/19 1314 102/60     Pulse Rate 01/24/19 1314 100     Resp 01/24/19 1314 18     Temp 01/24/19 1314 98 F (36.7 C)  Temp Source 01/24/19 1314 Oral     SpO2 01/24/19 1314 97 %     Weight 01/24/19 1315 180 lb (81.6 kg)     Height 01/24/19 1315 5\' 3"  (1.6 m)     Head Circumference --      Peak Flow --      Pain Score 01/24/19 1315 6     Pain Loc --      Pain Edu? --      Excl. in GC? --     Constitutional: Alert and oriented. Well appearing and in no acute distress. Eyes: Conjunctivae are normal.  Head: Atraumatic. Nose: No congestion/rhinnorhea. Mouth/Throat: Mucous membranes are moist.   Neck:  supple no lymphadenopathy noted Cardiovascular: Normal rate, regular rhythm. Heart sounds are normal Respiratory:  Normal respiratory effort.  No retractions, lungs c t a  GU: deferred Musculoskeletal: FROM all extremities, warm and well perfused, left hip is tender at the trochanteric bursa Neurologic:  Normal speech and language.  Skin:  Skin is warm, dry and intact. No rash noted. Psychiatric: Mood and affect are normal. Speech and behavior are normal.  ____________________________________________   LABS (all labs ordered are listed, but only abnormal results are displayed)  Labs Reviewed  POC URINE PREG, ED   ____________________________________________   ____________________________________________  RADIOLOGY  X-ray left hip is negative  ____________________________________________   PROCEDURES  Procedure(s) performed: Ibuprofen p.o.   Procedures    ____________________________________________   INITIAL IMPRESSION / ASSESSMENT AND PLAN / ED COURSE  Pertinent labs & imaging results that were available during my care of the patient were reviewed by me and considered in my medical decision making (see chart for details).   Patient is 33 year old female presents emergency department complaining of left hip pain  Physical exam shows the trochanteric bursa to be tender.  X-ray of the left hip is negative  Patient was given ibuprofen while here in the ED.  Patient had to be woken up.  Then I explained the findings to the patient.  Explained her this is a bursitis.  She is placed on steroid and follow-up with orthopedics if not better in 5 to 7 days.  Apply ice to the left hip.  States she understands.  She discharged stable condition.     As part of my medical decision making, I reviewed the following data within the electronic MEDICAL RECORD NUMBER Nursing notes reviewed and incorporated, Old chart reviewed, Radiograph reviewed x-ray of the left hip is negative, Notes from prior ED visits and [place] Controlled Substance Database  ____________________________________________   FINAL  CLINICAL IMPRESSION(S) / ED DIAGNOSES  Final diagnoses:  Trochanteric bursitis of left hip      NEW MEDICATIONS STARTED DURING THIS VISIT:  New Prescriptions   PREDNISONE (STERAPRED UNI-PAK 21 TAB) 10 MG (21) TBPK TABLET    Take 6 pills on day one then decrease by 1 pill each day     Note:  This document was prepared using Dragon voice recognition software and may include unintentional dictation errors.    Faythe Ghee, PA-C 01/24/19 1527    Phineas Semen, MD 01/24/19 [phone]

## 2019-04-23 DIAGNOSIS — B009 Herpesviral infection, unspecified: Secondary | ICD-10-CM

## 2019-04-24 ENCOUNTER — Ambulatory Visit: Payer: Medicaid Other

## 2019-06-26 ENCOUNTER — Encounter: Payer: Self-pay | Admitting: Advanced Practice Midwife

## 2019-06-26 ENCOUNTER — Ambulatory Visit: Payer: Medicaid Other | Admitting: Advanced Practice Midwife

## 2019-06-26 ENCOUNTER — Other Ambulatory Visit: Payer: Self-pay

## 2019-06-26 DIAGNOSIS — Z113 Encounter for screening for infections with a predominantly sexual mode of transmission: Secondary | ICD-10-CM

## 2019-06-26 DIAGNOSIS — F1520 Other stimulant dependence, uncomplicated: Secondary | ICD-10-CM

## 2019-06-26 DIAGNOSIS — F4310 Post-traumatic stress disorder, unspecified: Secondary | ICD-10-CM | POA: Insufficient documentation

## 2019-06-26 LAB — WET PREP FOR TRICH, YEAST, CLUE
Trichomonas Exam: NEGATIVE
Yeast Exam: NEGATIVE

## 2019-06-26 NOTE — Progress Notes (Signed)
    STI clinic/screening visit  Subjective:  Natasha Riddle is a 33 y.o. female being seen today for an STI screening visit. The patient reports they do have symptoms.  Patient has the following medical conditions:   Patient Active Problem List   Diagnosis Date Noted  . PTSD (post-traumatic stress disorder) 06/26/2019  . Amphetamine use disorder, severe ([place]) 01/07/2019  . Fibromyalgia 01/07/2019  . Bipolar I disorder, most recent episode depressed ([place]) 01/07/2019  . Herpes 12/02/2016     Chief Complaint  Patient presents with  . Exposure to STD    HPI  Patient reports malodor with internal itching since yestereday  See flowsheet for further details and programmatic requirements.    The following portions of the patient's history were reviewed and updated as appropriate: allergies, current medications, past medical history, past social history, past surgical history and problem list.  Objective:  There were no vitals filed for this visit.  Physical Exam Vitals signs and nursing note reviewed.  Constitutional:      Appearance: Normal appearance.  HENT:     Head: Normocephalic and atraumatic.     Mouth/Throat:     Mouth: Mucous membranes are moist.     Pharynx: Oropharynx is clear. No oropharyngeal exudate or posterior oropharyngeal erythema.  Pulmonary:     Effort: Pulmonary effort is normal.  Abdominal:     General: Abdomen is flat.     Palpations: There is no mass.     Tenderness: There is no abdominal tenderness. There is no rebound.  Genitourinary:    General: Normal vulva.     Exam position: Lithotomy position.     Pubic Area: No rash or pubic lice.      Labia:        Right: No rash or lesion.        Left: No rash or lesion.      Vagina: Bleeding (moderate red menses blood, ph>4.5) present. No vaginal discharge, erythema or lesions.     Cervix: No cervical motion tenderness, discharge, friability, lesion or erythema.     Uterus: Normal.      Adnexa: Right  adnexa normal and left adnexa normal.     Rectum: Normal.  Lymphadenopathy:     Head:     Right side of head: No preauricular or posterior auricular adenopathy.     Left side of head: No preauricular or posterior auricular adenopathy.     Cervical: No cervical adenopathy.     Upper Body:     Right upper body: No supraclavicular or axillary adenopathy.     Left upper body: No supraclavicular or axillary adenopathy.     Lower Body: No right inguinal adenopathy. No left inguinal adenopathy.  Skin:    General: Skin is warm and dry.     Findings: No rash.  Neurological:     Mental Status: She is alert and oriented to person, place, and time.       Assessment and Plan:  Natasha Riddle is a 33 y.o. female presenting to the [facility] Department for STI screening  1. Screening examination for venereal disease Treat wet mount per standing orders Immunization nurse consult - WET PREP FOR [place], YEAST, [place] [facility]     Return if symptoms worsen or fail to improve.  No future appointments.  Herbie Saxon, CNM

## 2019-06-26 NOTE — Progress Notes (Addendum)
Here for STD screening. Declines blood work today.Jenetta Downer, RN
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
## 2019-07-01 LAB — GONOCOCCUS CULTURE

## 2019-07-02 ENCOUNTER — Telehealth: Payer: Self-pay | Admitting: General Practice
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
## 2019-07-04 NOTE — Telephone Encounter (Signed)
TC to patient. LM with number to call..Natasha Noblet Brewer-Jensen, RN  

## 2019-07-05 ENCOUNTER — Encounter: Payer: Self-pay | Admitting: Advanced Practice Midwife

## 2019-07-09 ENCOUNTER — Ambulatory Visit: Payer: Medicaid Other

## 2019-07-11 ENCOUNTER — Encounter: Payer: Self-pay | Admitting: Advanced Practice Midwife

## 2019-07-17 NOTE — Addendum Note (Signed)
Addended by: Cletis Media on: 07/17/2019 01:00 PM   Modules accepted: Orders

## 2021-03-19 ENCOUNTER — Encounter: Payer: Self-pay | Admitting: Emergency Medicine

## 2021-03-19 ENCOUNTER — Ambulatory Visit
Admission: EM | Admit: 2021-03-19 | Discharge: 2021-03-19 | Disposition: A | Discharge status: Home / Self Care | Payer: No Typology Code available for payment source | Source: Ambulatory Visit | Attending: Emergency Medicine | Admitting: Emergency Medicine

## 2021-03-19 ENCOUNTER — Emergency Department
Admission: EM | Admit: 2021-03-19 | Discharge: 2021-03-19 | Disposition: A | Discharge status: Home / Self Care | Payer: Medicaid Other | Attending: Emergency Medicine | Admitting: Emergency Medicine

## 2021-03-19 DIAGNOSIS — S40812A Abrasion of left upper arm, initial encounter: Secondary | ICD-10-CM | POA: Diagnosis not present

## 2021-03-19 DIAGNOSIS — S80811A Abrasion, right lower leg, initial encounter: Secondary | ICD-10-CM | POA: Insufficient documentation

## 2021-03-19 DIAGNOSIS — X58XXXA Exposure to other specified factors, initial encounter: Secondary | ICD-10-CM | POA: Insufficient documentation

## 2021-03-19 DIAGNOSIS — S80812A Abrasion, left lower leg, initial encounter: Secondary | ICD-10-CM | POA: Diagnosis not present

## 2021-03-19 DIAGNOSIS — Z0441 Encounter for examination and observation following alleged adult rape: Secondary | ICD-10-CM | POA: Insufficient documentation

## 2021-03-19 DIAGNOSIS — S40811A Abrasion of right upper arm, initial encounter: Secondary | ICD-10-CM | POA: Insufficient documentation

## 2021-03-19 DIAGNOSIS — Y92009 Unspecified place in unspecified non-institutional (private) residence as the place of occurrence of the external cause: Secondary | ICD-10-CM | POA: Diagnosis not present

## 2021-03-19 DIAGNOSIS — T7421XA Adult sexual abuse, confirmed, initial encounter: Secondary | ICD-10-CM

## 2021-03-19 DIAGNOSIS — F17210 Nicotine dependence, cigarettes, uncomplicated: Secondary | ICD-10-CM | POA: Insufficient documentation

## 2021-03-19 DIAGNOSIS — S4991XA Unspecified injury of right shoulder and upper arm, initial encounter: Secondary | ICD-10-CM | POA: Diagnosis present

## 2021-03-19 LAB — URINALYSIS, ROUTINE W REFLEX MICROSCOPIC
Bacteria, UA: NONE SEEN
Bilirubin Urine: NEGATIVE
Glucose, UA: NEGATIVE mg/dL
Ketones, ur: 80 mg/dL — AB
Leukocytes,Ua: NEGATIVE
Nitrite: NEGATIVE
Protein, ur: 30 mg/dL — AB
Specific Gravity, Urine: 1.027 (ref 1.005–1.030)
pH: 5 (ref 5.0–8.0)

## 2021-03-19 LAB — COMPREHENSIVE METABOLIC PANEL
ALT: 28 U/L (ref 0–44)
AST: 57 U/L — ABNORMAL HIGH (ref 15–41)
Albumin: 4.4 g/dL (ref 3.5–5.0)
Alkaline Phosphatase: 47 U/L (ref 38–126)
Anion gap: 7 (ref 5–15)
BUN: 21 mg/dL — ABNORMAL HIGH (ref 6–20)
CO2: 25 mmol/L (ref 22–32)
Calcium: 8.7 mg/dL — ABNORMAL LOW (ref 8.9–10.3)
Chloride: 105 mmol/L (ref 98–111)
Creatinine, Ser: 0.52 mg/dL (ref 0.44–1.00)
GFR, Estimated: >60 mL/min (ref >60)
Glucose, Bld: 143 mg/dL — ABNORMAL HIGH (ref 70–99)
Potassium: 3 mmol/L — ABNORMAL LOW (ref 3.5–5.1)
Sodium: 137 mmol/L (ref 135–145)
Total Bilirubin: 0.9 mg/dL (ref 0.3–1.2)
Total Protein: 8 g/dL (ref 6.5–8.1)

## 2021-03-19 LAB — CHLAMYDIA/NGC RT PCR (ARMC ONLY)
Chlamydia Tr: NOT DETECTED
N gonorrhoeae: NOT DETECTED

## 2021-03-19 LAB — PREGNANCY, URINE: Preg Test, Ur: NEGATIVE

## 2021-03-19 LAB — HEPATITIS B SURFACE ANTIGEN: Hepatitis B Surface Ag: NONREACTIVE

## 2021-03-19 LAB — RAPID HIV SCREEN (HIV 1/2 AB+AG)
HIV 1/2 Antibodies: NONREACTIVE
HIV-1 P24 Antigen - HIV24: NONREACTIVE

## 2021-03-19 LAB — HIV ANTIBODY (ROUTINE TESTING W REFLEX): HIV Screen 4th Generation wRfx: NONREACTIVE

## 2021-03-19 LAB — HEPATITIS C ANTIBODY: HCV Ab: REACTIVE — AB

## 2021-03-19 MED ORDER — METRONIDAZOLE 500 MG PO TABS
2000.0000 mg | ORAL_TABLET | Freq: Once | ORAL | Status: AC
  Administered 2021-03-19: 2000 mg via ORAL
  Filled 2021-03-19: qty 4

## 2021-03-19 MED ORDER — AZITHROMYCIN 500 MG PO TABS
1000.0000 mg | ORAL_TABLET | Freq: Once | ORAL | Status: AC
  Administered 2021-03-19: 1000 mg via ORAL
  Filled 2021-03-19: qty 2

## 2021-03-19 MED ORDER — DOXYCYCLINE HYCLATE 100 MG PO CAPS: 100.0000 mg | ORAL_CAPSULE | Freq: Two times a day (BID) | ORAL | 0 refills | Status: AC

## 2021-03-19 MED ORDER — LIDOCAINE HCL (PF) 1 % IJ SOLN
1.0000 mL | Freq: Once | INTRAMUSCULAR | Status: AC
  Administered 2021-03-19: 1 mL
  Filled 2021-03-19: qty 5

## 2021-03-19 MED ORDER — CEFTRIAXONE SODIUM 1 G IJ SOLR
500.0000 mg | Freq: Once | INTRAMUSCULAR | Status: AC
  Administered 2021-03-19: 500 mg via INTRAMUSCULAR
  Filled 2021-03-19: qty 10
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
## 2021-03-19 NOTE — ED Notes (Signed)
Pt discharged at this time in [facility] custody.
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
## 2021-03-19 NOTE — ED Notes (Signed)
SANE remains in room with pt at this time.
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
## 2021-03-19 NOTE — ED Notes (Signed)
Pt provided with Coke at this time.

## 2021-03-19 NOTE — ED Notes (Signed)
SANE at bedside assessing pt at this time.

## 2021-03-20 LAB — RPR: RPR Ser Ql: NONREACTIVE

## 2021-03-24 ENCOUNTER — Telehealth: Payer: Self-pay | Admitting: Emergency Medicine

## 2021-03-24 NOTE — Telephone Encounter (Signed)
Called patient to inform of need for further testing for hepatitis c.  There was a voicemail and the voice was female, so I did not leave a message.

## 2021-11-23 ENCOUNTER — Encounter: Payer: Self-pay | Admitting: Emergency Medicine

## 2021-11-23 ENCOUNTER — Inpatient Hospital Stay
Admission: EM | Admit: 2021-11-23 | Discharge: 2021-11-26 | DRG: 442 | Discharge status: Against Medical Advice | Payer: Medicaid Other | Attending: Family Medicine | Admitting: Family Medicine

## 2021-11-23 ENCOUNTER — Other Ambulatory Visit: Payer: Self-pay

## 2021-11-23 ENCOUNTER — Emergency Department: Payer: Medicaid Other

## 2021-11-23 DIAGNOSIS — Z79899 Other long term (current) drug therapy: Secondary | ICD-10-CM | POA: Diagnosis not present

## 2021-11-23 DIAGNOSIS — A0472 Enterocolitis due to Clostridium difficile, not specified as recurrent: Secondary | ICD-10-CM | POA: Diagnosis present

## 2021-11-23 DIAGNOSIS — Z825 Family history of asthma and other chronic lower respiratory diseases: Secondary | ICD-10-CM | POA: Diagnosis not present

## 2021-11-23 DIAGNOSIS — D500 Iron deficiency anemia secondary to blood loss (chronic): Secondary | ICD-10-CM | POA: Diagnosis present

## 2021-11-23 DIAGNOSIS — E878 Other disorders of electrolyte and fluid balance, not elsewhere classified: Secondary | ICD-10-CM | POA: Diagnosis present

## 2021-11-23 DIAGNOSIS — Z8249 Family history of ischemic heart disease and other diseases of the circulatory system: Secondary | ICD-10-CM

## 2021-11-23 DIAGNOSIS — B179 Acute viral hepatitis, unspecified: Secondary | ICD-10-CM

## 2021-11-23 DIAGNOSIS — R112 Nausea with vomiting, unspecified: Secondary | ICD-10-CM

## 2021-11-23 DIAGNOSIS — K759 Inflammatory liver disease, unspecified: Secondary | ICD-10-CM | POA: Diagnosis present

## 2021-11-23 DIAGNOSIS — F3130 Bipolar disorder, current episode depressed, mild or moderate severity, unspecified: Secondary | ICD-10-CM | POA: Diagnosis present

## 2021-11-23 DIAGNOSIS — F1590 Other stimulant use, unspecified, uncomplicated: Secondary | ICD-10-CM | POA: Diagnosis present

## 2021-11-23 DIAGNOSIS — Z809 Family history of malignant neoplasm, unspecified: Secondary | ICD-10-CM

## 2021-11-23 DIAGNOSIS — Z833 Family history of diabetes mellitus: Secondary | ICD-10-CM | POA: Diagnosis not present

## 2021-11-23 DIAGNOSIS — F1110 Opioid abuse, uncomplicated: Secondary | ICD-10-CM | POA: Diagnosis not present

## 2021-11-23 DIAGNOSIS — Z20822 Contact with and (suspected) exposure to covid-19: Secondary | ICD-10-CM | POA: Diagnosis present

## 2021-11-23 DIAGNOSIS — B1710 Acute hepatitis C without hepatic coma: Principal | ICD-10-CM | POA: Diagnosis present

## 2021-11-23 DIAGNOSIS — Z823 Family history of stroke: Secondary | ICD-10-CM | POA: Diagnosis not present

## 2021-11-23 DIAGNOSIS — E871 Hypo-osmolality and hyponatremia: Secondary | ICD-10-CM | POA: Diagnosis present

## 2021-11-23 DIAGNOSIS — Z9851 Tubal ligation status: Secondary | ICD-10-CM

## 2021-11-23 DIAGNOSIS — Z818 Family history of other mental and behavioral disorders: Secondary | ICD-10-CM | POA: Diagnosis not present

## 2021-11-23 DIAGNOSIS — F17210 Nicotine dependence, cigarettes, uncomplicated: Secondary | ICD-10-CM | POA: Diagnosis present

## 2021-11-23 DIAGNOSIS — Z5329 Procedure and treatment not carried out because of patient's decision for other reasons: Secondary | ICD-10-CM | POA: Diagnosis present

## 2021-11-23 DIAGNOSIS — D509 Iron deficiency anemia, unspecified: Secondary | ICD-10-CM

## 2021-11-23 DIAGNOSIS — K8020 Calculus of gallbladder without cholecystitis without obstruction: Secondary | ICD-10-CM | POA: Diagnosis present

## 2021-11-23 HISTORY — DX: Acute viral hepatitis, unspecified: B17.9

## 2021-11-23 LAB — CBC
HCT: 37.1 % (ref 36.0–46.0)
Hemoglobin: 11.7 g/dL — ABNORMAL LOW (ref 12.0–15.0)
MCH: 23.3 pg — ABNORMAL LOW (ref 26.0–34.0)
MCHC: 31.5 g/dL (ref 30.0–36.0)
MCV: 73.8 fL — ABNORMAL LOW (ref 80.0–100.0)
Platelets: 354 10*3/uL (ref 150–400)
RBC: 5.03 MIL/uL (ref 3.87–5.11)
RDW: 16.9 % — ABNORMAL HIGH (ref 11.5–15.5)
WBC: 7.7 10*3/uL (ref 4.0–10.5)
nRBC: 0 % (ref 0.0–0.2)

## 2021-11-23 LAB — COMPREHENSIVE METABOLIC PANEL
ALT: 1238 U/L — ABNORMAL HIGH (ref 0–44)
AST: 1693 U/L — ABNORMAL HIGH (ref 15–41)
Albumin: 4 g/dL (ref 3.5–5.0)
Alkaline Phosphatase: 133 U/L — ABNORMAL HIGH (ref 38–126)
Anion gap: 11 (ref 5–15)
BUN: 18 mg/dL (ref 6–20)
CO2: 25 mmol/L (ref 22–32)
Calcium: 9.1 mg/dL (ref 8.9–10.3)
Chloride: 95 mmol/L — ABNORMAL LOW (ref 98–111)
Creatinine, Ser: 0.62 mg/dL (ref 0.44–1.00)
GFR, Estimated: >60 mL/min (ref >60)
Glucose, Bld: 89 mg/dL (ref 70–99)
Potassium: 4.2 mmol/L (ref 3.5–5.1)
Sodium: 131 mmol/L — ABNORMAL LOW (ref 135–145)
Total Bilirubin: 1 mg/dL (ref 0.3–1.2)
Total Protein: 8.4 g/dL — ABNORMAL HIGH (ref 6.5–8.1)

## 2021-11-23 LAB — URINALYSIS, ROUTINE W REFLEX MICROSCOPIC
Bacteria, UA: NONE SEEN
Bilirubin Urine: NEGATIVE
Glucose, UA: NEGATIVE mg/dL
Hgb urine dipstick: NEGATIVE
Ketones, ur: NEGATIVE mg/dL
Nitrite: NEGATIVE
Protein, ur: NEGATIVE mg/dL
Specific Gravity, Urine: 1.026 (ref 1.005–1.030)
pH: 5 (ref 5.0–8.0)

## 2021-11-23 LAB — ACETAMINOPHEN LEVEL: Acetaminophen (Tylenol), Serum: <10 ug/mL — ABNORMAL LOW (ref 10–30)

## 2021-11-23 LAB — RESP PANEL BY RT-PCR (FLU A&B, COVID) ARPGX2
Influenza A by PCR: NEGATIVE
Influenza B by PCR: NEGATIVE
SARS Coronavirus 2 by RT PCR: NEGATIVE

## 2021-11-23 LAB — PROTIME-INR
INR: 1.1 (ref 0.8–1.2)
Prothrombin Time: 14.3 seconds (ref 11.4–15.2)

## 2021-11-23 LAB — POC URINE PREG, ED: Preg Test, Ur: NEGATIVE

## 2021-11-23 LAB — LIPASE, BLOOD: Lipase: 30 U/L (ref 11–51)

## 2021-11-23 MED ORDER — ONDANSETRON HCL 4 MG/2ML IJ SOLN
4.0000 mg | Freq: Four times a day (QID) | INTRAMUSCULAR | Status: DC | PRN
Start: 2021-11-23 — End: 2021-11-26
  Administered 2021-11-23 – 2021-11-24 (×2): 4 mg via INTRAVENOUS
  Filled 2021-11-23 (×2): qty 2

## 2021-11-23 MED ORDER — ONDANSETRON HCL 4 MG PO TABS: 4.0000 mg | ORAL_TABLET | Freq: Four times a day (QID) | ORAL | Status: DC | PRN

## 2021-11-23 MED ORDER — IBUPROFEN 100 MG/5ML PO SUSP: 200.0000 mg | Freq: Four times a day (QID) | ORAL | Status: DC | PRN

## 2021-11-23 MED ORDER — ENOXAPARIN SODIUM 40 MG/0.4ML IJ SOSY
40.0000 mg | PREFILLED_SYRINGE | INTRAMUSCULAR | Status: DC
  Administered 2021-11-24 – 2021-11-25 (×2): 40 mg via SUBCUTANEOUS
  Filled 2021-11-23 (×2): qty 0.4

## 2021-11-23 MED ORDER — MAGNESIUM HYDROXIDE 400 MG/5ML PO SUSP: 30.0000 mL | Freq: Every day | ORAL | Status: DC | PRN

## 2021-11-23 MED ORDER — MELATONIN 5 MG PO TABS
2.5000 mg | ORAL_TABLET | Freq: Every evening | ORAL | Status: DC | PRN
  Administered 2021-11-24: 2.5 mg via ORAL
  Filled 2021-11-23 (×2): qty 1

## 2021-11-23 MED ORDER — IBUPROFEN 100 MG/5ML PO SUSP
200.0000 mg | Freq: Four times a day (QID) | ORAL | Status: DC | PRN
  Filled 2021-11-23: qty 10

## 2021-11-23 MED ORDER — DEXTROSE-NACL 5-0.9 % IV SOLN: INTRAVENOUS | Status: DC

## 2021-11-23 MED ORDER — KETOROLAC TROMETHAMINE 15 MG/ML IJ SOLN
15.0000 mg | Freq: Four times a day (QID) | INTRAMUSCULAR | Status: DC | PRN
Start: 2021-11-23 — End: 2021-11-26

## 2021-11-23 NOTE — H&P (Addendum)
?  ?  ? ? ? ?PATIENT NAME: Natasha Riddle   ? ?MR#:  [id number] ? ?DATE OF BIRTH:  [birth year] ? ?DATE OF ADMISSION:  11/23/2021 ? ?PRIMARY CARE PHYSICIAN: Pcp, No  ? ?Patient is coming from: Home ? ?REQUESTING/REFERRING PHYSICIAN: Valora Piccolo, MD ? ?CHIEF COMPLAINT:  ? ?Chief Complaint  ?Patient presents with  ?? Nausea  ?? Vomiting  ?? Abdominal Pain  ? ? ?HISTORY OF PRESENT ILLNESS:  ?Natasha Riddle is a 36 y.o. Caucasian female with medical history significant for bipolar disorder as well as IV heroin abuse, who presented to the emergency room with acute onset of nausea, vomiting and diarrhea as well as abdominal pain over the last 4 days.  She has not been on any medications for a long time for her bipolar disorder.  She was not able to keep any p.o. intake and denies taking any new medications.  No herbal supplementation.  She denied eating outside home prior to her symptoms.  She smokes half a pack of cigarettes per day.  No chest pain or palpitations.  No cough or wheezing hemoptysis.  No bleeding diathesis.  No jaundice or hematemesis. ? ?ED Course: When she came to the ER, vital signs were within normal.  Labs revealed mild hyponatremia and hypochloremia.  AST was 1693 and ALT 1238 with alk phos of 133 and total protein of 8.4 and albumin 4.  CBC showed hemoglobin of 11.7 hematocrit 37.1 with low RBC indicis.  INR was 1.1 and PT 14.3.  Tylenol level was less than 10.  Urine pregnancy test was negative.  Influenza antigens and COVID-19 PCR came back negative.  UA showed trace leukocytes with 0-5 WBCs.  He rest B surface antigen came back nonreactive and hepatitis C virus antibodies came back reactive. ? ?Imaging:  Right upper quad ultrasound revealed cholelithiasis with no evidence for cholecystitis. ? ?The patient will be admitted to a PCU bed for further evaluation and management. ?PAST MEDICAL HISTORY:  ? ?Past Medical History:  ?Diagnosis Date  ?? Depression   ?Bipolar disorder ?IV heroin abuse ?-  Methamphetamine abuse ?PAST SURGICAL HISTORY:  ? ?Past Surgical History:  ?Procedure Laterality Date  ?? TONSILLECTOMY    ?? TUBAL LIGATION N/A 06/10/2017  ? Procedure: POST PARTUM TUBAL LIGATION;  Surgeon: Gae Dry, MD;  Location: [facility] ORS;  Service: Gynecology;  Laterality: N/A;  ? ? ?SOCIAL HISTORY:  ? ?Social History  ? ?Tobacco Use  ?? Smoking status: Every Day  ?  Packs/day: 0.50  ?  Years: 20.00  ?  Pack years: 10.00  ?  Types: Cigarettes  ?? Smokeless tobacco: Never  ?Substance Use Topics  ?? Alcohol use: Not Currently  ? ? ?FAMILY HISTORY:  ? ?Family History  ?Problem Relation Age of Onset  ?? Bipolar disorder Mother   ?? Ovarian cysts Mother   ?? Asthma Father   ?? Asthma Son   ?? Hypothyroidism Maternal Aunt   ?? Hypertension Maternal Grandfather   ?? Diabetes Maternal Grandfather   ?? Cancer Maternal Grandfather   ?? Hypertension Paternal Grandfather   ?? Heart attack Paternal Grandfather   ?? Stroke Paternal Grandfather   ? ? ?DRUG ALLERGIES:  ?No Known Allergies ? ?REVIEW OF SYSTEMS:  ? ?ROS ?As per history of present illness. All pertinent systems were reviewed above. Constitutional, HEENT, cardiovascular, respiratory, GI, GU, musculoskeletal, neuro, psychiatric, endocrine, integumentary and hematologic systems were reviewed and are otherwise negative/unremarkable except for positive findings mentioned above in the HPI. ? ? ?  MEDICATIONS AT HOME:  ? ?Prior to Admission medications   ?Medication Sig Start Date End Date Taking? Authorizing Provider  ?DULoxetine (CYMBALTA) 20 MG capsule Take 1 capsule (20 mg total) by mouth daily. 01/11/19   Clapacs, Madie Reno, MD  ?hydrOXYzine (ATARAX/VISTARIL) 50 MG tablet Take 1 tablet (50 mg total) by mouth every 6 (six) hours as needed for anxiety. 01/10/19   Clapacs, Madie Reno, MD  ?lithium 600 MG capsule Take 1 capsule (600 mg total) by mouth at bedtime. 01/10/19   Clapacs, Madie Reno, MD  ?metroNIDAZOLE (FLAGYL) 500 MG tablet Take 1 tablet (500 mg total) by mouth every  12 (twelve) hours. 01/10/19   Clapacs, Madie Reno, MD  ?predniSONE (STERAPRED UNI-PAK 21 TAB) 10 MG (21) TBPK tablet Take 6 pills on day one then decrease by 1 pill each day 01/24/19   Versie Starks, PA-C  ?pregabalin (LYRICA) 25 MG capsule Take 1 capsule (25 mg total) by mouth 2 (two) times daily. 01/10/19   Clapacs, Madie Reno, MD  ?QUEtiapine (SEROQUEL) 200 MG tablet Take 1 tablet (200 mg total) by mouth at bedtime. 01/10/19   Clapacs, Madie Reno, MD  ?traZODone (DESYREL) 100 MG tablet Take 1 tablet (100 mg total) by mouth at bedtime as needed for sleep. 01/10/19   Clapacs, Madie Reno, MD  ? ?  ? ?VITAL SIGNS:  ?Blood pressure (!) 99/59, pulse 62, temperature 98.3 ?F (36.8 ?C), resp. rate 15, height _0  (1.6 m), weight 63.5 kg, last menstrual period 11/09/2021, SpO2 100 %, unknown if currently breastfeeding. ? ?PHYSICAL EXAMINATION:  ?Physical Exam ? ?GENERAL:  36 y.o.-year-old Caucasian female patient lying in the bed with no acute distress.  ?EYES: Pupils equal, round, reactive to light and accommodation. No scleral icterus. Extraocular muscles intact.  ?HEENT: Head atraumatic, normocephalic. Oropharynx and nasopharynx clear.  ?NECK:  Supple, no jugular venous distention. No thyroid enlargement, no tenderness.  ?LUNGS: Normal breath sounds bilaterally, no wheezing, rales,rhonchi or crepitation. No use of accessory muscles of respiration.  ?CARDIOVASCULAR: Regular rate and rhythm, S1, S2 normal. No murmurs, rubs, or gallops.  ?ABDOMEN: Soft, nondistended, with right upper quadrant abdominal tenderness without rebound tenderness guarding or rigidity.  Bowel sounds present. No organomegaly or mass.  ?EXTREMITIES: No pedal edema, cyanosis, or clubbing.   ?NEUROLOGIC: Cranial nerves II through XII are intact. Muscle strength 5/5 in all extremities. Sensation intact. Gait not checked.  ?PSYCHIATRIC: The patient is alert and oriented x 3.  Normal affect and good eye contact. ?SKIN: She has injection track marks in her wrists.  No other  rash, lesion, or ulcer.  ? ?LABORATORY PANEL:  ? ?CBC ?Recent Labs  ?Lab 11/24/21 ?0338  ?WBC 9.8  ?HGB 10.8*  ?HCT 33.9*  ?PLT 320  ? ?------------------------------------------------------------------------------------------------------------------ ? ?Chemistries  ?Recent Labs  ?Lab 11/24/21 ?0338  ?NA 134*  ?K 3.6  ?CL 99  ?CO2 28  ?GLUCOSE 129*  ?BUN 14  ?CREATININE 0.56  ?CALCIUM 8.8*  ?AST 1,413*  ?ALT 1,142*  ?ALKPHOS 124  ?BILITOT 0.6  ? ?------------------------------------------------------------------------------------------------------------------ ? ?Cardiac Enzymes ?No results for input(s): TROPONINI in the last 168 hours. ?------------------------------------------------------------------------------------------------------------------ ? ?RADIOLOGY:  ?US Abdomen Limited RUQ (LIVER/GB) ? ?Result Date: 11/23/2021 ?CLINICAL DATA:  Transaminitis EXAM: ULTRASOUND ABDOMEN LIMITED RIGHT UPPER QUADRANT COMPARISON:  None. FINDINGS: Gallbladder: Multiple gallstones within the gallbladder measuring up to 1.5 cm. No wall thickening or sonographic Murphy sign. Common bile duct: Diameter: Normal caliber, 5 mm Liver: No focal lesion identified. Within normal limits in parenchymal echogenicity. Portal  vein is patent on color Doppler imaging with normal direction of blood flow towards the liver. Other: None. IMPRESSION: Cholelithiasis.  No sonographic evidence of acute cholecystitis. No acute findings. Electronically Signed   By: Rolm Baptise M.D.   On: 11/23/2021 21:46   ? ? ? 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 ?- The patient will be admitted to a PCU bed. ?- We will place on hydration with D5 normal saline. ?- We will follow serial LFTs. ?- GI consultation will be obtained. ?- Dr. Vicente Males was notified about the patient. ?- I added hepatitis C core antibody IgM. ? ?Heroin abuse ([place]) ?- She will be monitored and managed for  withdrawal. ?- Psychiatry consultation can be obtained in a.m. ?- The patient stated that she is interested and was planning to go for rehabilitation soon. ? ?Bipolar I disorder, most recent episode depressed (H

## 2021-11-23 NOTE — ED Triage Notes (Signed)
Pt to ED from home c/o generalized abd pain and n/v/d x4 days.  Vomiting x6 and diarrhea x2 in last 24 hours.  States after eating comes right back up.  Denies urinary changes.  Pt A&Ox4, chest rise even and unlabored, skin WNL and in NAD at this time. ?

## 2021-11-23 NOTE — ED Provider Notes (Signed)
? ?[facility] ?Provider Note ? ? Event Date/Time  ? First MD Initiated Contact with Patient 11/23/21 2130   ?  (approximate) ?History  ?Nausea, Vomiting, and Abdominal Pain ? ?HPI ?Natasha Riddle is a 36 y.o. 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  Patient also states that she has been having intermittent epigastric and right upper quadrant abdominal pain.  Patient describes 8/10, nonradiating, aching pain that does not radiate.  Patient denies any recent travel, sick contacts, or known contacts with hepatitis with which she shares needles.  Denies any fever, cough, chest pain, shortness of breath, dysuria, or abnormal vaginal discharge ?Physical Exam  ?Triage Vital Signs: ?ED Triage Vitals  ?Enc Vitals Group  ?   BP 11/23/21 1908 115/81  ?   Pulse Rate 11/23/21 1908 82  ?   Resp 11/23/21 1908 16  ?   Temp 11/23/21 1908 98.3 ?F (36.8 ?C)  ?   Temp Source 11/23/21 1908 Oral  ?   SpO2 11/23/21 1908 100 %  ?   Weight 11/23/21 1905 140 lb (63.5 kg)  ?   Height 11/23/21 1905 $RemoveBefor'5\' 3"'[id number]$  (1.6 m)  ?   Head Circumference --   ?   Peak Flow --   ?   Pain Score 11/23/21 1905 8  ?   Pain Loc --   ?   Pain Edu? --   ?   Excl. in [place]? --   ? ?Most recent vital signs: ?Vitals:  ? 11/23/21 1908  ?BP: 115/81  ?Pulse: 82  ?Resp: 16  ?Temp: 98.3 ?F (36.8 ?C)  ?SpO2: 100%  ? ?General: Awake, oriented x4. ?CV:  Good peripheral perfusion.  ?Resp:  Normal effort.  ?Abd:  No distention.  Mild right upper quadrant tenderness palpation ?Other:  Middle-aged Caucasian female laying in bed in no distress ?ED Results / Procedures / Treatments  ?Labs ?(all labs ordered are listed, but only abnormal results are displayed) ?Labs Reviewed  ?COMPREHENSIVE METABOLIC PANEL - Abnormal; Notable for the following components:  ?    Result Value  ? Sodium 131 (*)   ? Chloride 95 (*)   ? Total Protein 8.4 (*)   ? AST 1,693 (*)   ? ALT 1,238 (*)    ? Alkaline Phosphatase 133 (*)   ? All other components within normal limits  ?CBC - Abnormal; Notable for the following components:  ? Hemoglobin 11.7 (*)   ? MCV 73.8 (*)   ? MCH 23.3 (*)   ? RDW 16.9 (*)   ? All other components within normal limits  ?URINALYSIS, ROUTINE W REFLEX MICROSCOPIC - Abnormal; Notable for the following components:  ? Color, Urine AMBER (*)   ? APPearance HAZY (*)   ? Leukocytes,Ua TRACE (*)   ? All other components within normal limits  ?ACETAMINOPHEN LEVEL - Abnormal; Notable for the following components:  ? Acetaminophen (Tylenol), Serum <10 (*)   ? All other components within normal limits  ?RESP PANEL BY RT-PCR (FLU A&B, COVID) ARPGX2  ?LIPASE, BLOOD  ?PROTIME-INR  ?HEPATITIS PANEL, ACUTE  ?POC URINE PREG, ED  ? ?RADIOLOGY ?ED MD interpretation: Right upper quadrant ultrasound as interpreted by me shows cholelithiasis without sonographic evidence of acute cholecystitis.  No acute findings appreciated.  Specifically liver shows no focal lesions and parenchyma echogenicity within normal limits ?-Agree with radiology assessment ?Official radiology report(s): ?US Abdomen Limited RUQ (LIVER/GB) ? ?Result Date: 11/23/2021 ?  CLINICAL DATA:  Transaminitis EXAM: ULTRASOUND ABDOMEN LIMITED RIGHT UPPER QUADRANT COMPARISON:  None. FINDINGS: Gallbladder: Multiple gallstones within the gallbladder measuring up to 1.5 cm. No wall thickening or sonographic Murphy sign. Common bile duct: Diameter: Normal caliber, 5 mm Liver: No focal lesion identified. Within normal limits in parenchymal echogenicity. Portal vein is patent on color Doppler imaging with normal direction of blood flow towards the liver. Other: None. IMPRESSION: Cholelithiasis.  No sonographic evidence of acute cholecystitis. No acute findings. Electronically Signed   By: Rolm Baptise M.D.   On: 11/23/2021 21:46   ?PROCEDURES: ?Critical Care performed: Yes, see critical care procedure note(s) ?Procedures ?MEDICATIONS ORDERED IN  ED: ?Medications - No data to display ?IMPRESSION / MDM / ASSESSMENT AND PLAN / ED COURSE  ?I reviewed the triage vital signs and the nursing notes. ?             ?               ?Differential diagnosis includes, but is not limited to, cholecystitis, ascending cholangitis, hepatitis, gastritis, gastroenteritis, sepsis ?The patient is on the cardiac monitor to evaluate for evidence of arrhythmia and/or significant heart rate changes. ?Patient a 36 year old female who presents for intermittent nausea/vomiting/diarrhea/right upper quadrant abdominal pain in the setting of of current IV methamphetamine use.  Physical exam relatively benign with only mild right upper quadrant tenderness to palpation.  Laboratory evaluation significant for elevated transaminases with AST 1693, ALT 1238, and alk phos 133.  CBC shows mildly decreased hemoglobin at 11.7.  Right upper quadrant ultrasound does not show any evidence of acute liver parenchymal changes.  Cholelithiasis appreciated without evidence of cholecystitis. ?Consults: ?GI-spoke to Dr. Vicente Males who recommends admission to the internal medicine services patient's INR is 1.1, Tylenol level normal, mental status normal, and hemodynamically stable ?Hospitalist-spoke to Dr. Sidney Ace who agrees to accept this patient onto his service for further evaluation and management. ? ?Dispo: Admit to medicine ? ?  ?FINAL CLINICAL IMPRESSION(S) / ED DIAGNOSES  ? ?Final diagnoses:  ?Acute hepatitis  ?Nausea vomiting and diarrhea  ? ?Rx / DC Orders  ? ?ED Discharge Orders   ? ? None  ? ?  ? ?Note:  This document was prepared using Dragon voice recognition software and may include unintentional dictation errors. ?  ?Naaman Plummer, MD ?11/23/21 2244 ? ?

## 2021-11-23 NOTE — ED Notes (Signed)
Dr Mansy  at bedside at this time 

## 2021-11-24 DIAGNOSIS — D509 Iron deficiency anemia, unspecified: Secondary | ICD-10-CM

## 2021-11-24 DIAGNOSIS — A0472 Enterocolitis due to Clostridium difficile, not specified as recurrent: Secondary | ICD-10-CM

## 2021-11-24 DIAGNOSIS — F1110 Opioid abuse, uncomplicated: Secondary | ICD-10-CM

## 2021-11-24 DIAGNOSIS — D500 Iron deficiency anemia secondary to blood loss (chronic): Secondary | ICD-10-CM

## 2021-11-24 DIAGNOSIS — K759 Inflammatory liver disease, unspecified: Secondary | ICD-10-CM | POA: Diagnosis present

## 2021-11-24 DIAGNOSIS — F3130 Bipolar disorder, current episode depressed, mild or moderate severity, unspecified: Secondary | ICD-10-CM

## 2021-11-24 DIAGNOSIS — B179 Acute viral hepatitis, unspecified: Secondary | ICD-10-CM | POA: Diagnosis not present

## 2021-11-24 HISTORY — DX: Opioid abuse, uncomplicated: F11.10

## 2021-11-24 HISTORY — DX: Iron deficiency anemia secondary to blood loss (chronic): D50.0

## 2021-11-24 LAB — CBC
HCT: 33.9 % — ABNORMAL LOW (ref 36.0–46.0)
Hemoglobin: 10.8 g/dL — ABNORMAL LOW (ref 12.0–15.0)
MCH: 23.6 pg — ABNORMAL LOW (ref 26.0–34.0)
MCHC: 31.9 g/dL (ref 30.0–36.0)
MCV: 74.2 fL — ABNORMAL LOW (ref 80.0–100.0)
Platelets: 320 10*3/uL (ref 150–400)
RBC: 4.57 MIL/uL (ref 3.87–5.11)
RDW: 16.8 % — ABNORMAL HIGH (ref 11.5–15.5)
WBC: 9.8 10*3/uL (ref 4.0–10.5)
nRBC: 0 % (ref 0.0–0.2)

## 2021-11-24 LAB — GASTROINTESTINAL PANEL BY PCR, STOOL (REPLACES STOOL CULTURE)

## 2021-11-24 LAB — COMPREHENSIVE METABOLIC PANEL
ALT: 1142 U/L — ABNORMAL HIGH (ref 0–44)
AST: 1413 U/L — ABNORMAL HIGH (ref 15–41)
Albumin: 3.7 g/dL (ref 3.5–5.0)
Alkaline Phosphatase: 124 U/L (ref 38–126)
Anion gap: 7 (ref 5–15)
BUN: 14 mg/dL (ref 6–20)
CO2: 28 mmol/L (ref 22–32)
Calcium: 8.8 mg/dL — ABNORMAL LOW (ref 8.9–10.3)
Chloride: 99 mmol/L (ref 98–111)
Creatinine, Ser: 0.56 mg/dL (ref 0.44–1.00)
GFR, Estimated: >60 mL/min (ref >60)
Glucose, Bld: 129 mg/dL — ABNORMAL HIGH (ref 70–99)
Potassium: 3.6 mmol/L (ref 3.5–5.1)
Sodium: 134 mmol/L — ABNORMAL LOW (ref 135–145)
Total Bilirubin: 0.6 mg/dL (ref 0.3–1.2)
Total Protein: 7.5 g/dL (ref 6.5–8.1)

## 2021-11-24 LAB — HEPATITIS B CORE ANTIBODY, IGM: Hep B C IgM: NONREACTIVE

## 2021-11-24 LAB — HEPATITIS A ANTIBODY, TOTAL: hep A Total Ab: NONREACTIVE

## 2021-11-24 LAB — HEPATITIS PANEL, ACUTE
HCV Ab: REACTIVE — AB
Hep A IgM: NONREACTIVE
Hep B C IgM: NONREACTIVE
Hepatitis B Surface Ag: NONREACTIVE

## 2021-11-24 LAB — C DIFFICILE QUICK SCREEN W PCR REFLEX
C Diff antigen: POSITIVE — AB
C Diff interpretation: DETECTED
C Diff toxin: POSITIVE — AB

## 2021-11-24 LAB — PROTIME-INR
INR: 1.1 (ref 0.8–1.2)
Prothrombin Time: 14.1 seconds (ref 11.4–15.2)

## 2021-11-24 MED ORDER — CLONIDINE HCL 0.1 MG PO TABS: 0.1000 mg | ORAL_TABLET | Freq: Three times a day (TID) | ORAL | Status: DC | PRN

## 2021-11-24 MED ORDER — HYDROXYZINE HCL 50 MG PO TABS
25.0000 mg | ORAL_TABLET | Freq: Three times a day (TID) | ORAL | Status: DC | PRN
  Administered 2021-11-24 – 2021-11-25 (×3): 25 mg via ORAL
  Filled 2021-11-24 (×3): qty 1

## 2021-11-24 MED ORDER — PROCHLORPERAZINE EDISYLATE 10 MG/2ML IJ SOLN
10.0000 mg | Freq: Four times a day (QID) | INTRAMUSCULAR | Status: DC | PRN
  Filled 2021-11-24: qty 2

## 2021-11-24 MED ORDER — VANCOMYCIN HCL 125 MG PO CAPS
125.0000 mg | ORAL_CAPSULE | Freq: Four times a day (QID) | ORAL | Status: DC
  Administered 2021-11-24 – 2021-11-25 (×7): 125 mg via ORAL
  Filled 2021-11-24 (×7): qty 1

## 2021-11-24 MED ORDER — DIPHENHYDRAMINE HCL 25 MG PO CAPS: 25.0000 mg | ORAL_CAPSULE | Freq: Four times a day (QID) | ORAL | Status: DC | PRN

## 2021-11-24 NOTE — Assessment & Plan Note (Addendum)
Iron deficient ?- Start oral iron ?- Give IV iron ?- Follow-up with GI as an outpatient ?

## 2021-11-24 NOTE — Consult Note (Signed)
?  ?Wyline Mood , MD ?[address], [place], [place], [address] ?[address], [address], [place], [place], [address] ?Phone: [phone]  ?Fax: [phone] ? Consultation ? ?Referring Provider:     ER ?Primary Care Physician:  Pcp, No ?Primary Gastroenterologist:  none          ?Reason for Consultation:     Abnormal LFTs ? ?Date of Admission:  11/23/2021 ?Date of Consultation:  11/24/2021 ?       ? HPI:   ?Natasha Riddle is a 36 y.o. female presented to the emergency room with nausea vomiting and abdominal pain.  History of IV heroin abuse. ? ?She states that for the past week if not more has been having nausea vomiting abdominal pain.  Denies any alcohol intake.  She does admit to actively using heroin cannot recollect when she last used it was a bit reluctant to answer questions when I was asking her.  Denies any over-the-counter medication denies any excess Tylenol use denies any herbal supplements.  Denies any complaints presently. ?In the emergency room she underwent blood tests which demonstrated an elevation of AST thousand 693 ALT thousand 238 alkaline phosphatase 133 and total bilirubin of 1, hemoglobin 11.7 g with an MCV of 73 and a platelet count of 354, urine analysis showed trace leukocytes , urine pregnancy test negative.  INR 1.1 and Tylenol level less than 10.  This morning LFTs were repeated and had improved in terms of transaminases. ? ?Right upper quadrant ultrasound shows cholelithiasis no evidence of acute cholecystitis Common bile duct diameter is 5 mm. ? ?Past Medical History:  ?Diagnosis Date  ?? Depression   ? ? ?Past Surgical History:  ?Procedure Laterality Date  ?? TONSILLECTOMY    ?? TUBAL LIGATION N/A 06/10/2017  ? Procedure: POST PARTUM TUBAL LIGATION;  Surgeon: Nadara Mustard, MD;  Location: [facility] ORS;  Service: Gynecology;  Laterality: N/A;  ? ? ?Prior to Admission medications   ?Medication Sig Start Date End Date Taking? Authorizing Provider  ?DULoxetine (CYMBALTA) 20 MG  capsule Take 1 capsule (20 mg total) by mouth daily. ?Patient not taking: Reported on 11/24/2021 01/11/19   Clapacs, Jackquline Denmark, MD  ?hydrOXYzine (ATARAX/VISTARIL) 50 MG tablet Take 1 tablet (50 mg total) by mouth every 6 (six) hours as needed for anxiety. ?Patient not taking: Reported on 11/24/2021 01/10/19   Clapacs, Jackquline Denmark, MD  ?lithium 600 MG capsule Take 1 capsule (600 mg total) by mouth at bedtime. ?Patient not taking: Reported on 11/24/2021 01/10/19   Clapacs, Jackquline Denmark, MD  ?metroNIDAZOLE (FLAGYL) 500 MG tablet Take 1 tablet (500 mg total) by mouth every 12 (twelve) hours. ?Patient not taking: Reported on 11/24/2021 01/10/19   Clapacs, Jackquline Denmark, MD  ?predniSONE (STERAPRED UNI-PAK 21 TAB) 10 MG (21) TBPK tablet Take 6 pills on day one then decrease by 1 pill each day ?Patient not taking: Reported on 11/24/2021 01/24/19   Faythe Ghee, PA-C  ?pregabalin (LYRICA) 25 MG capsule Take 1 capsule (25 mg total) by mouth 2 (two) times daily. ?Patient not taking: Reported on 11/24/2021 01/10/19   Clapacs, Jackquline Denmark, MD  ?QUEtiapine (SEROQUEL) 200 MG tablet Take 1 tablet (200 mg total) by mouth at bedtime. ?Patient not taking: Reported on 11/24/2021 01/10/19   Clapacs, Jackquline Denmark, MD  ?traZODone (DESYREL) 100 MG tablet Take 1 tablet (100 mg total) by mouth at bedtime as needed for sleep. ?Patient not taking: Reported on 11/24/2021 01/10/19   Clapacs, Jackquline Denmark, MD  ? ? ?  Family History  ?Problem Relation Age of Onset  ?? Bipolar disorder Mother   ?? Ovarian cysts Mother   ?? Asthma Father   ?? Asthma Son   ?? Hypothyroidism Maternal Aunt   ?? Hypertension Maternal Grandfather   ?? Diabetes Maternal Grandfather   ?? Cancer Maternal Grandfather   ?? Hypertension Paternal Grandfather   ?? Heart attack Paternal Grandfather   ?? Stroke Paternal Grandfather   ?  ? ?Social History  ? ?Tobacco Use  ?? Smoking status: Every Day  ?  Packs/day: 0.50  ?  Years: 20.00  ?  Pack years: 10.00  ?  Types: Cigarettes  ?? Smokeless tobacco: Never  ?Substance Use Topics  ?? Alcohol  use: Not Currently  ?? Drug use: Yes  ?  Types: Methamphetamines  ? ? ?Allergies as of 11/23/2021  ?? (No Known Allergies)  ? ? ?Review of Systems:    ?All systems reviewed and negative except where noted in HPI. ? ? Physical Exam:  ?Vital signs in last 24 hours: ?Temp:  [98 ?F (36.7 ?C)-98.3 ?F (36.8 ?C)] 98 ?F (36.7 ?C) (04/05 0630) ?Pulse Rate:  [62-82] 77 (04/05 0630) ?Resp:  [15-16] 16 (04/05 0630) ?BP: (99-125)/(59-81) 108/63 (04/05 0630) ?SpO2:  [100 %] 100 % (04/05 0630) ?Weight:  [63.5 kg] 63.5 kg (04/04 1905) ?  ?General:   Pleasant, cooperative in NAD ?Head:  Normocephalic and atraumatic. ?Eyes:   No icterus.   Conjunctiva pink. PERRLA. ?Ears:  Normal auditory acuity. ?Neck:  Supple; no masses or thyroidomegaly ?Lungs: Respirations even and unlabored. Lungs clear to auscultation bilaterally.   No wheezes, crackles, or rhonchi.  ?Heart:  Regular rate and rhythm;  Without murmur, clicks, rubs or gallops ?Abdomen:  Soft, nondistended, nontender. Normal bowel sounds. No appreciable masses or hepatomegaly.  No rebound or guarding.  ?Neurologic:  Alert and oriented x3;  grossly normal neurologically. ?Skin:  Intact without significant lesions or rashes. ?Cervical Nodes:  No significant cervical adenopathy. ?Psych:  Alert and cooperative. Normal affect. ? ?LAB RESULTS: ?Recent Labs  ?  11/23/21 ?1908 11/24/21 ?0338  ?WBC 7.7 9.8  ?HGB 11.7* 10.8*  ?HCT 37.1 33.9*  ?PLT 354 320  ? ?BMET ?Recent Labs  ?  11/23/21 ?1908 11/24/21 ?0338  ?NA 131* 134*  ?K 4.2 3.6  ?CL 95* 99  ?CO2 25 28  ?GLUCOSE 89 129*  ?BUN 18 14  ?CREATININE 0.62 0.56  ?CALCIUM 9.1 8.8*  ? ?LFT ?Recent Labs  ?  11/24/21 ?0338  ?PROT 7.5  ?ALBUMIN 3.7  ?AST 1,413*  ?ALT 1,142*  ?ALKPHOS 124  ?BILITOT 0.6  ? ?PT/INR ?Recent Labs  ?  11/23/21 ?2143  ?LABPROT 14.3  ?INR 1.1  ? ? ?STUDIES: ?US Abdomen Limited RUQ (LIVER/GB) ? ?Result Date: 11/23/2021 ?CLINICAL DATA:  Transaminitis EXAM: ULTRASOUND ABDOMEN LIMITED RIGHT UPPER QUADRANT COMPARISON:  None.  FINDINGS: Gallbladder: Multiple gallstones within the gallbladder measuring up to 1.5 cm. No wall thickening or sonographic Murphy sign. Common bile duct: Diameter: Normal caliber, 5 mm Liver: No focal lesion identified. Within normal limits in parenchymal echogenicity. Portal vein is patent on color Doppler imaging with normal direction of blood flow towards the liver. Other: None. IMPRESSION: Cholelithiasis.  No sonographic evidence of acute cholecystitis. No acute findings. Electronically Signed   By: Charlett Nose M.D.   On: 11/23/2021 21:46   ? ? ? Impression / Plan:  ? ?Natasha Riddle is a 36 y.o. y/o female presents the emergency room with nausea vomiting abdominal pain diarrhea.  Significant elevation in the transaminases no evidence of acute liver failure and acute hepatitis with significantly elevated LFTs has a differential diagnosis of toxins, acute viral hepatitis and ischemic hepatitis.  In her case the pretest probability for an acute viral hepatitis is more likely.  Hepatitis A also presents with similar features although it is transmitted through contaminated food.  There is no evidence of liver failure hence conservative management with appropriate at this point of time.  No evidence of elevated Tylenol levels. ? ?Plan ?1.  Daily CBC, CMP, PT/INR.  If INR greater than 1.5 indicates acute liver failure then [facility].  If she develops hepatic encephalopathy that would also be a indication to transfer. ? ?2.  I will order further labs for EBV, HSV, CMV, VZV, hepatitis C viral load hepatitis B viral load to ensure she is not in the window.  Or has coinfection. ? ?3.  Once we have her labs back and have a diagnosis , LFTs are trending down then would not have any indication from the GI point to feel to stay in the hospital and can be followed up as an outpatient. ? ?4.  She claims she still has diarrhea at this point of time and if she does have diarrhea recommend her to  have stool tests for GI PCR and C. difficile. ? ?Thank you for involving me in the care of this patient.   ? ? ? LOS: 1 day  ? ?Wyline MoodKiran Rehana Uncapher, MD  11/24/2021, 7:49 AM ? ?   ?

## 2021-11-24 NOTE — Assessment & Plan Note (Addendum)
Patient has frequent watery diarrhea, abdominal cramps ?C. difficile positive antigen and toxin ?Stools already starting to be more formed, no stools yet today ?-Continue oral vancomycin ?

## 2021-11-24 NOTE — Assessment & Plan Note (Addendum)
Discussed Suboxone, patient would like to defer.  Symptomatic management detox/withdrawal ? ?Patient having increasing signs of withdrawal, but these are mild to moderate overall ?- Continue clonidine, hydroxyzine, Zofran as needed ?- TOC for outpatient substance use treatment ?-Plans for [facility] intake on Monday ?

## 2021-11-24 NOTE — Hospital Course (Signed)
Ms Petrovic is a 37 y.o. F with bipolar disorder and IVDU who presented with few days of nausea, vomiting and abdominal pain. ? ?In the ER, AST/ALT 1693/1238, Tbili 1, INR normal, acetaminophen level undetectable.  Hep C Abx reactive.  GI were consulted and the patient was admitted for further work up. ?

## 2021-11-24 NOTE — Assessment & Plan Note (Addendum)
Denies alcohol, supplements, acetaminophen.  No family history of liver disease but uncertain of family history.   ? ?INR remains normal, AST and ALT improving considerably today   ?- Trend LFTs and INR ?- Follow varicella, CMV, HSV, EBV, hep C RNA, hepatitis B DNA ?- Consult GI ? ?-Plan for short interval PT INR/LFTs on Monday with GI, then GI office follow-up in 2 to 4-week ? ?  ? ? ?

## 2021-11-24 NOTE — Progress Notes (Signed)
After blood draw completed by RN, NT attempted to change soiled linens; patient refused stating to NT "it's my blood." Patient has room assigned, will transport in wheelchair and clean room appropriately upon admission. ?

## 2021-11-24 NOTE — Progress Notes (Signed)
?  Progress Note ? ? ?Patient: Natasha Riddle XTK:[id number] DOB: Sep 05, 1985 DOA: 11/23/2021     1 ?DOS: the patient was seen and examined on 11/24/2021 ?  ?Brief hospital course: ?Ms Mortellaro is a 36 y.o. F with bipolar disorder and IVDU who presented with few days of nausea, vomiting and abdominal pain. ? ?In the ER, AST/ALT 1693/1238, Tbili 1, INR normal, acetaminophen level undetectable.  Hep C Abx reactive.  GI were consulted and the patient was admitted for further work up. ? ?Assessment and Plan: ?* Acute hepatitis ?Denies alcohol, supplements, acetaminophen.  No family history of liver disease but uncertain of family history.   ? ?INR normal, synthetic liver function seems to be normal at this time.  LFTs minimal improvement from yesterday. ?- Trend LFTs and INR ?- Follow varicella, CMV, HSV, EBV, hep C RNA, hepatitis B DNA ?- Consult GI ? ?  ? ?Clostridium difficile diarrhea ?Patient has frequent watery diarrhea, abdominal cramps ?C. difficile positive antigen and toxin ?-Start oral vancomycin ? ?Microcytic anemia ?- Check iron studies ? ?Heroin abuse (HCC) ?Discussed Suboxone, patient would like to defer.  Symptomatic management detox/withdrawal, has some early signs of withdrawal at this point. ?- TOC for outpatient substance use treatment ?- Consult psychiatry ? ?Bipolar I disorder, most recent episode depressed (HCC) ?Not currently on medication ?- Consult psychiatry ? ? ? ? ? ?  ? ?Subjective: Patient is tired, she still has some loose stools, she still has some abdominal cramps.  She has a little bit of jitteriness, but no full-fledged withdrawals, sweats, pain.  No confusion.  No jaundice. ? ?Physical Exam: ?Vitals:  ? 11/24/21 0630 11/24/21 0749 11/24/21 1000 11/24/21 1047  ?BP: 108/63  110/70 (!) 100/56  ?Pulse: 77 80 78 71  ?Resp: 16 17 15 18   ?Temp: 98 ?F (36.7 ?C)  98.2 ?F (36.8 ?C) 98.8 ?F (37.1 ?C)  ?TempSrc:   Oral   ?SpO2: 100% 99% 98% 100%  ?Weight:      ?Height:      ? ?Adult female, lying in bed,  interactive, no acute distress, appears uncomfortable. ?RRR, no murmurs, no peripheral edema ?No Stigmata of chronic liver disease ?Lung sounds clear, no rales or wheezes ?Mild abdominal tenderness diffusely, no hernia palpiated.  ? ? ? ? ? ?Data Reviewed: ?Gastroenterology notes reviewed, nursing notes reviewed, vital signs reviewed.  Ultrasound negative. ?LFTs essentially unchanged from yesterday, INR normal, total bilirubin normal, seaman level undetectable ? ?Family Communication:   ? ?Disposition: ?Status is: Inpatient ?The patient was admitted with severe hepatitis.  At this point there is no liver failure, but she will need monitoring of INR, monitoring for hepatic encephalopathy, and transfer to a tertiary care center if these develop, versus home if her LFTs start to improve. ? ? ? ? ? ? Planned Discharge Destination: Home ? ? ?  ? ?Author: ? , MD ?11/24/2021 1:25 PM ? ?For on call review [email or web address].  ?

## 2021-11-24 NOTE — Consult Note (Addendum)
[facility] Face-to-Face Psychiatry Consult  ? ?Reason for Consult: History of bipolar disorder ?Referring Physician: Maryfrances Bunnellanford ?Patient Identification: Natasha Riddle ?MRN:  [id number] ?Principal Diagnosis: Bipolar I disorder, most recent episode depressed (HCC) ?Diagnosis:  Principal Problem: ?  Bipolar I disorder, most recent episode depressed (HCC) ?Active Problems: ?  Acute hepatitis ?  Heroin abuse (HCC) ?  Microcytic anemia ?  Clostridium difficile diarrhea ? ? ?Total Time spent with patient: 30 minutes ? ?Subjective:   ?Natasha Riddle is a 36 y.o. female patient admitted with acute hepatitis. ? ?HPI: Patient seen.  Chart was reviewed.  She is inpatient on the medical unit for treatment of acute hepatitis.  Psychiatric consult was made for "Bipolar disorder with IV heroin use as well as methamphetamine use."  ?Patient seen with her significant other at bedside, with her permission.  Patient is honest about her heroin use and states that this is the primary concern, not her bipolar illness, and she is hoping to go right from the medical unit to substance use rehab.  She states that she does have bipolar illness and admits to being admitted to the inpatient unit here a couple of years ago.  She says that she does not want to start any medications at this time, as she has had several antidepressants and either they do not work for her or she just stopped taking them.  She is not been on any psychiatric medications for a couple of years.  She would like to address her substance addictions when she leaves the hospital after treatment for her acute hepatitis. ?Patient denies any thoughts of suicide.  Denies previous suicide attempts.  Denies auditory or visual hallucinations or homicidal thoughts.  Patient denies other illicit drug use or alcohol use. She speaks in linear sentences, coherent sentences.  She is calm, polite, pleasant. ?Patient does not appear to have acute psychiatric needs that would require inpatient psychiatric  care at this time.  Recommend to patient that she follow through with rehab and certainly address her bipolar illness, as left untreated, it can certainly have an influence on "self-medicating"  with heroin. why she continues to use heroin.  Patient agrees. She has declined inpatient psychiatric referral when she has completed her medical treatment. This provider contacted  Representative from [facility], Lorella NimrodHarvey, who has agreed to come by and see patient tomorrow and offer outpatient resources for substance use and mental health treatment.  ? ?Past Psychiatric History: Bipolar 1 disorder.  History of hospitalization in 2020. ? ?Risk to Self:   ?Risk to Others:   ?Prior Inpatient Therapy:   ?Prior Outpatient Therapy:   ? ?Past Medical History:  ?Past Medical History:  ?Diagnosis Date  ? Depression   ?  ?Past Surgical History:  ?Procedure Laterality Date  ? TONSILLECTOMY    ? TUBAL LIGATION N/A 06/10/2017  ? Procedure: POST PARTUM TUBAL LIGATION;  Surgeon: Nadara MustardHarris, Robert P, MD;  Location: [facility] ORS;  Service: Gynecology;  Laterality: N/A;  ? ?Family History:  ?Family History  ?Problem Relation Age of Onset  ? Bipolar disorder Mother   ? Ovarian cysts Mother   ? Asthma Father   ? Asthma Son   ? Hypothyroidism Maternal Aunt   ? Hypertension Maternal Grandfather   ? Diabetes Maternal Grandfather   ? Cancer Maternal Grandfather   ? Hypertension Paternal Grandfather   ? Heart attack Paternal Grandfather   ? Stroke Paternal Grandfather   ? ?Social History:  ?Social History  ? ?Substance and Sexual Activity  ?Alcohol  Use Not Currently  ?   ?Social History  ? ?Substance and Sexual Activity  ?Drug Use Yes  ? Types: Methamphetamines  ?  ?Social History  ? ?Socioeconomic History  ? Marital status: Single  ?  Spouse name: Not on file  ? Number of children: Not on file  ? Years of education: Not on file  ? Highest education level: Not on file  ?Occupational History  ? Not on file  ?Tobacco Use  ? Smoking status: Every Day  ?  Packs/day:  0.50  ?  Years: 20.00  ?  Pack years: 10.00  ?  Types: Cigarettes  ? Smokeless tobacco: Never  ?Substance and Sexual Activity  ? Alcohol use: Not Currently  ? Drug use: Yes  ?  Types: Methamphetamines  ? Sexual activity: Yes  ?  Birth control/protection: Surgical  ?  Comment: planning tubal ligation  ?Other Topics Concern  ? Not on file  ?Social History Narrative  ? Not on file  ? ?Social Determinants of Health  ? ?Financial Resource Strain: Not on file  ?Food Insecurity: Not on file  ?Transportation Needs: Not on file  ?Physical Activity: Not on file  ?Stress: Not on file  ?Social Connections: Not on file  ? ?Additional Social History: ?  ? ?Allergies:  No Known Allergies ? ?Labs:  ?Results for orders placed or performed during the hospital encounter of 11/23/21 (from the past 48 hour(s))  ?Lipase, blood     Status: None  ? Collection Time: 11/23/21  7:08 PM  ?Result Value Ref Range  ? Lipase 30 11 - 51 U/L  ?  Comment: Performed at [facility], [address], [place], [place] [address]  ?Comprehensive metabolic panel     Status: Abnormal  ? Collection Time: 11/23/21  7:08 PM  ?Result Value Ref Range  ? Sodium 131 (L) 135 - 145 mmol/L  ? Potassium 4.2 3.5 - 5.1 mmol/L  ? Chloride 95 (L) 98 - 111 mmol/L  ? CO2 25 22 - 32 mmol/L  ? Glucose, Bld 89 70 - 99 mg/dL  ?  Comment: Glucose reference range applies only to samples taken after fasting for at least 8 hours.  ? BUN 18 6 - 20 mg/dL  ? Creatinine, Ser 0.62 0.44 - 1.00 mg/dL  ? Calcium 9.1 8.9 - 10.3 mg/dL  ? Total Protein 8.4 (H) 6.5 - 8.1 g/dL  ? Albumin 4.0 3.5 - 5.0 g/dL  ? AST 1,693 (H) 15 - 41 U/L  ? ALT 1,238 (H) 0 - 44 U/L  ? Alkaline Phosphatase 133 (H) 38 - 126 U/L  ? Total Bilirubin 1.0 0.3 - 1.2 mg/dL  ? GFR, Estimated >60 >60 mL/min  ?  Comment: (NOTE) ?Calculated using the CKD-EPI Creatinine Equation (2021) ?  ? Anion gap 11 5 - 15  ?  Comment: Performed at [facility], [address] [place], [place] [address]  ?CBC     Status:  Abnormal  ? Collection Time: 11/23/21  7:08 PM  ?Result Value Ref Range  ? WBC 7.7 4.0 - 10.5 K/uL  ? RBC 5.03 3.87 - 5.11 MIL/uL  ? Hemoglobin 11.7 (L) 12.0 - 15.0 g/dL  ? HCT 37.1 36.0 - 46.0 %  ? MCV 73.8 (L) 80.0 - 100.0 fL  ? MCH 23.3 (L) 26.0 - 34.0 pg  ? MCHC 31.5 30.0 - 36.0 g/dL  ? RDW 16.9 (H) 11.5 - 15.5 %  ? Platelets 354 150 - 400 K/uL  ?  nRBC 0.0 0.0 - 0.2 %  ?  Comment: Performed at [facility], [address] [place], [place] [address]  ?Urinalysis, Routine w reflex microscopic     Status: Abnormal  ? Collection Time: 11/23/21  7:08 PM  ?Result Value Ref Range  ? Color, Urine AMBER (A) YELLOW  ?  Comment: BIOCHEMICALS MAY BE AFFECTED BY COLOR  ? APPearance HAZY (A) CLEAR  ? Specific Gravity, Urine 1.026 1.005 - 1.030  ? pH 5.0 5.0 - 8.0  ? Glucose, UA NEGATIVE NEGATIVE mg/dL  ? Hgb urine dipstick NEGATIVE NEGATIVE  ? Bilirubin Urine NEGATIVE NEGATIVE  ? Ketones, ur NEGATIVE NEGATIVE mg/dL  ? Protein, ur NEGATIVE NEGATIVE mg/dL  ? Nitrite NEGATIVE NEGATIVE  ? Leukocytes,Ua TRACE (A) NEGATIVE  ? RBC / HPF 0-5 0 - 5 RBC/hpf  ? WBC, UA 0-5 0 - 5 WBC/hpf  ? Bacteria, UA NONE SEEN NONE SEEN  ? Squamous Epithelial / LPF 0-5 0 - 5  ? Mucus PRESENT   ?  Comment: Performed at [facility], [address] [place], [place] [address]  ?POC urine preg, ED     Status: None  ? Collection Time: 11/23/21  7:08 PM  ?Result Value Ref Range  ? Preg Test, Ur NEGATIVE NEGATIVE  ?  Comment:        ?THE SENSITIVITY OF THIS ?METHODOLOGY IS >24 mIU/mL ?  ?Resp Panel by RT-PCR (Flu A&B, Covid) Nasopharyngeal Swab     Status: None  ? Collection Time: 11/23/21  9:10 PM  ? Specimen: Nasopharyngeal Swab; Nasopharyngeal(NP) swabs in vial transport medium  ?Result Value Ref Range  ? SARS Coronavirus 2 by RT PCR NEGATIVE NEGATIVE  ?  Comment: (NOTE) ?SARS-CoV-2 target nucleic acids are NOT DETECTED. ? ?The SARS-CoV-2 RNA is generally detectable in upper respiratory ?specimens during the acute phase of infection.  The lowest ?concentration of SARS-CoV-2 viral copies this assay can detect is ?138 copies/mL. A negative result does not preclude SARS-Cov-2 ?infection and should not be used as the sole basis for t

## 2021-11-24 NOTE — Assessment & Plan Note (Addendum)
Not currently on medication, evaluated by psychiatry felt she was well compensated.  She was referred to outpatient mental health services.  Psychiatry felt initiating mood stabilizers at this time in the setting of her liver disease was unsafe.  ?

## 2021-11-25 ENCOUNTER — Other Ambulatory Visit (HOSPITAL_COMMUNITY): Payer: Self-pay

## 2021-11-25 DIAGNOSIS — B179 Acute viral hepatitis, unspecified: Secondary | ICD-10-CM | POA: Diagnosis not present

## 2021-11-25 LAB — HEPATITIS B DNA, ULTRAQUANTITATIVE, PCR
HBV DNA SERPL PCR-ACNC: NOT DETECTED IU/mL
HBV DNA SERPL PCR-LOG IU: UNDETERMINED log10 IU/mL

## 2021-11-25 LAB — COMPREHENSIVE METABOLIC PANEL
ALT: 664 U/L — ABNORMAL HIGH (ref 0–44)
AST: 566 U/L — ABNORMAL HIGH (ref 15–41)
Albumin: 3.2 g/dL — ABNORMAL LOW (ref 3.5–5.0)
Alkaline Phosphatase: 107 U/L (ref 38–126)
Anion gap: 6 (ref 5–15)
BUN: 8 mg/dL (ref 6–20)
CO2: 28 mmol/L (ref 22–32)
Calcium: 8.3 mg/dL — ABNORMAL LOW (ref 8.9–10.3)
Chloride: 102 mmol/L (ref 98–111)
Creatinine, Ser: 0.43 mg/dL — ABNORMAL LOW (ref 0.44–1.00)
GFR, Estimated: >60 mL/min (ref >60)
Glucose, Bld: 129 mg/dL — ABNORMAL HIGH (ref 70–99)
Potassium: 4.6 mmol/L (ref 3.5–5.1)
Sodium: 136 mmol/L (ref 135–145)
Total Bilirubin: 0.4 mg/dL (ref 0.3–1.2)
Total Protein: 6.6 g/dL (ref 6.5–8.1)

## 2021-11-25 LAB — HEPATITIS B E ANTIBODY: Hep B E Ab: NEGATIVE

## 2021-11-25 LAB — IRON AND TIBC
Iron: 20 ug/dL — ABNORMAL LOW (ref 28–170)
Saturation Ratios: 4 % — ABNORMAL LOW (ref 10.4–31.8)
TIBC: 459 ug/dL — ABNORMAL HIGH (ref 250–450)
UIBC: 439 ug/dL

## 2021-11-25 LAB — FERRITIN: Ferritin: 12 ng/mL (ref 11–307)

## 2021-11-25 LAB — RETICULOCYTES
Immature Retic Fract: 8.4 % (ref 2.3–15.9)
RBC.: 4.18 MIL/uL (ref 3.87–5.11)
Retic Count, Absolute: 33 10*3/uL (ref 19.0–186.0)
Retic Ct Pct: 0.8 % (ref 0.4–3.1)

## 2021-11-25 LAB — HCV RNA QUANT
HCV Quantitative Log: 3.707 log10 IU/mL (ref >1.70)
HCV Quantitative: 5090 IU/mL (ref >50)

## 2021-11-25 LAB — PROTIME-INR
INR: 1.1 (ref 0.8–1.2)
Prothrombin Time: 14.4 seconds (ref 11.4–15.2)

## 2021-11-25 LAB — HEPATITIS B E ANTIGEN: Hep B E Ag: NEGATIVE

## 2021-11-25 MED ORDER — FERROUS SULFATE 325 (65 FE) MG PO TABS
325.0000 mg | ORAL_TABLET | Freq: Two times a day (BID) | ORAL | Status: DC
  Administered 2021-11-25 (×2): 325 mg via ORAL
  Filled 2021-11-25 (×2): qty 1

## 2021-11-25 MED ORDER — OXYMETAZOLINE HCL 0.05 % NA SOLN
1.0000 | Freq: Two times a day (BID) | NASAL | Status: DC | PRN
  Administered 2021-11-25: 1 via NASAL
  Filled 2021-11-25: qty 15

## 2021-11-25 MED ORDER — OXYMETAZOLINE HCL 0.05 % NA SOLN: 1.0000 | Freq: Two times a day (BID) | NASAL | Status: DC

## 2021-11-25 MED ORDER — POLYVINYL ALCOHOL 1.4 % OP SOLN: 2.0000 [drp] | OPHTHALMIC | Status: DC | PRN

## 2021-11-25 MED ORDER — SODIUM CHLORIDE 0.9 % IV SOLN
125.0000 mg | Freq: Every day | INTRAVENOUS | Status: DC
  Administered 2021-11-25: 125 mg via INTRAVENOUS
  Filled 2021-11-25: qty 10

## 2021-11-25 MED ORDER — LORATADINE 10 MG PO TABS
10.0000 mg | ORAL_TABLET | Freq: Every day | ORAL | Status: DC
  Administered 2021-11-25: 10 mg via ORAL
  Filled 2021-11-25: qty 1

## 2021-11-25 MED ORDER — SALINE SPRAY 0.65 % NA SOLN
1.0000 | NASAL | Status: DC | PRN
  Filled 2021-11-25: qty 44

## 2021-11-25 NOTE — TOC Initial Note (Signed)
Transition of Care (TOC) - Initial/Assessment Note  ? ? ?Patient Details  ?Name: Natasha Riddle ?MRN: [id number] ?Date of Birth: 07-07-1986 ? ?Transition of Care (TOC) CM/SW Contact:    ?Caryn Section, RN ?Phone Number: ?11/25/2021, 1:40 PM ? ?Clinical Narrative: Patient given substance abuse resources and PCP resources for preparation for discharge.  No further needs identified at this time.  TOC will check back.                ? ? ?Expected Discharge Plan: Home/Self Care ?  ? ? ?Patient Goals and CMS Choice ?  ?  ?  ? ?Expected Discharge Plan and Services ?Expected Discharge Plan: Home/Self Care ?In-house Referral: PCP / [facility] ?  ?  ?Living arrangements for the past 2 months: Single Family Home ?                ?  ?  ?  ?  ?  ?  ?  ?  ?  ?  ? ?Prior Living Arrangements/Services ?Living arrangements for the past 2 months: Single Family Home ?  ?Patient language and need for interpreter reviewed:: Yes (no interpreter required) ?Do you feel safe going back to the place where you live?: Yes      ?Need for Family Participation in Patient Care: Yes (Comment) ?Care giver support system in place?: Yes (comment) ?  ?Criminal Activity/Legal Involvement Pertinent to Current Situation/Hospitalization: No - Comment as needed ? ?Activities of Daily Living ?Home Assistive Devices/Equipment: None ?ADL Screening (condition at time of admission) ?Patient's cognitive ability adequate to safely complete daily activities?: Yes ?Is the patient deaf or have difficulty hearing?: No ?Does the patient have difficulty seeing, even when wearing glasses/contacts?: No ?Does the patient have difficulty concentrating, remembering, or making decisions?: No ?Patient able to express need for assistance with ADLs?: Yes ?Does the patient have difficulty dressing or bathing?: No ?Independently performs ADLs?: Yes (appropriate for developmental age) ?Does the patient have difficulty walking or climbing stairs?: No ?Weakness of Legs: None ?Weakness  of Arms/Hands: None ? ?Permission Sought/Granted ?Permission sought to share information with : Case Manager ?Permission granted to share information with : Yes, Verbal Permission Granted ?   ?   ?   ?   ? ?Emotional Assessment ?Appearance:: Appears stated age ?  ?  ?Orientation: : Oriented to Self, Oriented to Place, Oriented to  Time, Oriented to Situation ?Alcohol / Substance Use: Illicit Drugs ?Psych Involvement: No (comment) ? ?Admission diagnosis:  Acute hepatitis [B17.9] ?Hepatitis [K75.9] ?Nausea vomiting and diarrhea [R11.2, R19.7] ?Patient Active Problem List  ? Diagnosis Date Noted  ? Heroin abuse (HCC) 11/24/2021  ? Iron deficiency anemia due to chronic blood loss 11/24/2021  ? Clostridium difficile diarrhea 11/24/2021  ? Acute hepatitis 11/23/2021  ? PTSD (post-traumatic stress disorder) 06/26/2019  ? 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 Fibromyalgia 01/07/2019  ? Bipolar I disorder, most recent episode depressed (HCC) 01/07/2019  ? Herpes 12/02/2016  ? ?PCP:  Pcp, No ?Pharmacy:   ?[facility] [place] ([facility]),  - [address] ?[address] ?[place] [address] ?Phone: [phone] Fax: [phone] ? ?[facility] ?[address] ?[place] [address] ?Phone: [phone] Fax: [phone] ? ? ? ? ?Social Determinants of Health (SDOH) Interventions ?  ? ?Readmission Risk Interventions ?   ? View : No data to display.  ?  ?  ?  ? ? ? ?

## 2021-11-25 NOTE — Progress Notes (Signed)
?Progress Note ? ? ?Patient: Natasha Riddle [id number] DOB: Oct 01, 1985 DOA: 11/23/2021     2 ?DOS: the patient was seen and examined on 11/25/2021 ?  ?Brief hospital course: ?Ms Cavalcante is a 36 y.o. F with bipolar disorder and IVDU who presented with few days of nausea, vomiting and abdominal pain. ? ?In the ER, AST/ALT 1693/1238, Tbili 1, INR normal, acetaminophen level undetectable.  Hep C Abx reactive.  GI were consulted and the patient was admitted for further work up. ? ?Assessment and Plan: ?* Bipolar I disorder, most recent episode depressed ([place]) ?Not currently on medication, evaluated by psychiatry felt she was well compensated.  She was referred to outpatient mental health services.  Psychiatry felt initiating mood stabilizers at this time in the setting of her liver disease was unsafe.  ? ?Acute hepatitis ?Denies alcohol, supplements, acetaminophen.  No family history of liver disease but uncertain of family history.   ? ?INR remains normal, AST and ALT improving considerably today   ?- Trend LFTs and INR ?- Follow varicella, CMV, HSV, EBV, hep C RNA, hepatitis B DNA ?- Consult GI ? ?-Plan for short interval PT INR/LFTs on Monday with GI, then GI office follow-up in 2 to 4-week ? ?  ? ? ? ?Clostridium difficile diarrhea ?Patient has frequent watery diarrhea, abdominal cramps ?C. difficile positive antigen and toxin ?Stools already starting to be more formed, no stools yet today ?-Continue oral vancomycin ? ?Iron deficiency anemia due to chronic blood loss ?Iron deficient ?- Start oral iron ?- Give IV iron ?- Follow-up with GI as an outpatient ? ?Heroin abuse ([place]) ?Discussed Suboxone, patient would like to defer.  Symptomatic management detox/withdrawal ? ?Patient having increasing signs of withdrawal, but these are mild to moderate overall ?- Continue clonidine, hydroxyzine, Zofran as needed ?- TOC for outpatient substance use treatment ?-Plans for [facility] intake on Monday ? ? ? ? ?  ? ?Subjective: Patient is  having nasal congestion, jitters, sweats, hot and cold, malaise, generalized discomfort and restlessness.  She has no more abdominal pain, no right upper quadrant pain, no diarrhea, no hematochezia. ? ? ? ?Physical Exam: ?Vitals:  ? 11/24/21 1656 11/25/21 0529 11/25/21 [id number] 11/25/21 [id number]  ?BP: 106/69 116/70  107/77  ?Pulse: 71 82  85  ?Resp: 17 15 15    ?Temp: 98.2 ?F (36.8 ?C)  98.3 ?F (36.8 ?C)   ?TempSrc: Oral Oral Oral   ?SpO2: 99% 100% 100% 99%  ?Weight:      ?Height:      ? ?Home female, lying in bed, appears congested and uncomfortable, somewhat restless and agitated.  Attention is normal, affect is irritable, judgment insight appear normal, oriented to person, place, time, and situation, heart rate regular, no murmurs, lung sounds clear without rales or wheezes, abdomen soft no tenderness palpation in all quadrants. ? ? ? ? ?Data Reviewed: ?Discussed with gastroenterology, psychiatry.  Nursing notes reviewed, gastroenterology and psychiatry notes reviewed, vital signs reviewed. ?AST and ALT down to 506 100 from 1411 100 yesterday, iron sat is low, C. difficile positive, INR normal ? ? ? ? ? ?Family Communication:   ? ?Disposition: ?Status is: Inpatient ?The patient was admitted with acute liver injury.  This causes unclear exact, but our working diagnosis is acute hepatitis C infection. ? ?Her liver injury appears to be improving and she does not have signs of synthetic liver dysfunction at this time.  From a GI standpoint she could probably go although she has worsening symptoms of opiate withdrawal  which would benefit from inpatient detox treatment for another day in the hospital. ? ?Suspect this will be improved tomorrow we can discharge tomorrow morning ? ? ? ? Planned Discharge Destination: Home ? ? ? ?Time spent:  minutes ? ?Author: ?Edwin Dada, MD ?11/25/2021 3:24 PM ? ?For on call review [email or web address].  ?

## 2021-11-25 NOTE — TOC Benefit Eligibility Note (Addendum)
Patient Advocate Encounter ? ?Insurance verification completed.   ? ?The patient is currently admitted and upon discharge could be taking vancomycin 125 mg capsules. ? ?The current 10 day co-pay is, $4.00.  ? ?The patient is insured through [employer]  ? ? ? ?Roland Earl, CPhT ?Pharmacy Patient Advocate Specialist ?[facility] Patient Advocate Team ?Direct Number: [phone]  Fax: [phone] ? ? ? ? ? ?  ?

## 2021-11-25 NOTE — Progress Notes (Signed)
Cross Cover ?Patient signed out against medical advice due to family emergency with her kids  ?

## 2021-11-25 NOTE — Progress Notes (Signed)
Pt. Told this RN she had a family emergency and needed to leave. Pt. Reports her mom is going to the hospital and she needs to go watch her children. Manuela Schwartz, NP made aware of Pt. Reporting she needs to leave, and provider told this RN patient would have to leave AMA and that no prescriptions would be provided to patient if she leaves tonight. Pt. Made aware and Pt. Signed AMA form. Pt. Visualized walking out of the unit with her partner with a steady gait by this RN.   ?

## 2021-11-26 LAB — CMV DNA, QUANTITATIVE, PCR
CMV DNA Quant: NEGATIVE IU/mL
Log10 CMV Qn DNA Pl: UNDETERMINED log10 IU/mL

## 2021-11-26 LAB — VARICELLA-ZOSTER BY PCR: Varicella-Zoster, PCR: NEGATIVE

## 2021-11-26 NOTE — Discharge Summary (Signed)
?Physician Discharge Summary ?  ?Patient: Natasha LemonsBrooke Riddle MRN: [id number] DOB: 13-Dec-1985  ?Admit date:     11/23/2021  ?Discharge date: 11/26/21  ?Discharge Physician: Alberteen SamChristopher P Kishaun Erekson  ? ?PCP: Pcp, No  ? ?Recommendations at discharge:  ?Follow up with Gastroenterology as soon as able ?Repeat INR and LFTs within 1 week ?Follow up iron deficiency anemia ?If recurrent diarrhea, continue treatment for CDiff ? ? ? ? ?Patient left the hospital AMA in the middle of the night. ? ? ? ? ? ?Discharge Diagnoses: ?Principal Problem: ?  Acute hepatitis ?Active Problems: ?  Bipolar I disorder, most recent episode depressed (HCC) ?  Heroin abuse (HCC) ?  Iron deficiency anemia due to chronic blood loss ?  Clostridium difficile diarrhea ? ? ? ? ?  ? ?Hospital Course: ?[name] is a 36 y.o. F with bipolar disorder and IVDU who presented with few days of nausea, vomiting and abdominal pain. ? ?In the ER, AST/ALT 1693/1238, Tbili 1, INR normal, acetaminophen level undetectable.  Hep C Abx reactive.  GI were consulted and the patient was admitted for further work up. ? ? ? ?- Admitted and started on oral vancomycin for Cdiff ?- Hepatitis C Ab positive, RNA quant 3.7 log IU/mL ?- CMV DNA, varicella DNA and HSV PCR negative ?- LFTs improving ? ?Patient left hospital at midnight, without my knowledge.  I had shared with her that she needed close lab follow up and also that it was imperative that she have GI follow up and that liver injury was potentially life threatening and that Hep C was treatable and curable.  She was also given information on who to reach out to, where to get a PCP and where to go for substance use treatment and mental health treatment, but because she left at night, some of this information may not have been possible to give to her in written form.  I attempted to call patient but her only contact number was disconnected. ? ? ? ? ?  ? ? ?The results of significant diagnostics from this hospitalization (including  imaging, microbiology, ancillary and laboratory) are listed below for reference.  ? ?Imaging Studies: ?US Abdomen Limited RUQ (LIVER/GB) ? ?Result Date: 11/23/2021 ?CLINICAL DATA:  Transaminitis EXAM: ULTRASOUND ABDOMEN LIMITED RIGHT UPPER QUADRANT COMPARISON:  None. FINDINGS: Gallbladder: Multiple gallstones within the gallbladder measuring up to 1.5 cm. No wall thickening or sonographic Murphy sign. Common bile duct: Diameter: Normal caliber, 5 mm Liver: No focal lesion identified. Within normal limits in parenchymal echogenicity. Portal vein is patent on color Doppler imaging with normal direction of blood flow towards the liver. Other: None. IMPRESSION: Cholelithiasis.  No sonographic evidence of acute cholecystitis. No acute findings. Electronically Signed   By: Charlett NoseKevin  Dover M.D.   On: 11/23/2021 21:46   ? ?Microbiology: ?Results for orders placed or performed during the hospital encounter of 11/23/21  ?Resp Panel by RT-PCR (Flu A&B, Covid) Nasopharyngeal Swab     Status: None  ? Collection Time: 11/23/21  9:10 PM  ? Specimen: Nasopharyngeal Swab; Nasopharyngeal(NP) swabs in vial transport medium  ?Result Value Ref Range Status  ? SARS Coronavirus 2 by RT PCR NEGATIVE NEGATIVE Final  ?  Comment: (NOTE) ?SARS-CoV-2 target nucleic acids are NOT DETECTED. ? ?The SARS-CoV-2 RNA is generally detectable in upper respiratory ?specimens during the acute phase of infection. The lowest ?concentration of SARS-CoV-2 viral copies this assay can detect is ?138 copies/mL. A negative result does not preclude SARS-Cov-2 ?infection and should not be  used as the sole basis for treatment or ?other patient management decisions. A negative result may occur with  ?improper specimen collection/handling, submission of specimen other ?than nasopharyngeal swab, presence of viral mutation(s) within the ?areas targeted by this assay, and inadequate number of viral ?copies(<138 copies/mL). A negative result must be combined with ?clinical  observations, patient history, and epidemiological ?information. The expected result is Negative. ? ?Fact Sheet for Patients:  ?BloggerCourse.com ? ?Fact Sheet for Healthcare Providers:  ?SeriousBroker.it ? ?This test is no t yet approved or cleared by the [place] FDA and  ?has been authorized for detection and/or diagnosis of SARS-CoV-2 by ?FDA under an Emergency Use Authorization (EUA). This EUA will remain  ?in effect (meaning this test can be used) for the duration of the ?COVID-19 declaration under Section 564(b)(1) of the Act, 21 ?U.S.C.section 360bbb-3(b)(1), unless the authorization is terminated  ?or revoked sooner.  ? ? ?  ? Influenza A by PCR NEGATIVE NEGATIVE Final  ? Influenza B by PCR NEGATIVE NEGATIVE Final  ?  Comment: (NOTE) ?The Xpert Xpress SARS-CoV-2/FLU/RSV plus assay is intended as an aid ?in the diagnosis of influenza from Nasopharyngeal swab specimens and ?should not be used as a sole basis for treatment. Nasal washings and ?aspirates are unacceptable for Xpert Xpress SARS-CoV-2/FLU/RSV ?testing. ? ?Fact Sheet for Patients: ?BloggerCourse.com ? ?Fact Sheet for Healthcare Providers: ?SeriousBroker.it ? ?This test is not yet approved or cleared by the [place] FDA and ?has been authorized for detection and/or diagnosis of SARS-CoV-2 by ?FDA under an Emergency Use Authorization (EUA). This EUA will remain ?in effect (meaning this test can be used) for the duration of the ?COVID-19 declaration under Section 564(b)(1) of the Act, 21 U.S.C. ?section 360bbb-3(b)(1), unless the authorization is terminated or ?revoked. ? ?Performed at [facility], [address] [place], ?[place] [address] ?  ?Varicella-zoster by PCR (Blood or Swab)     Status: None  ? Collection Time: 11/24/21  8:41 AM  ? Specimen: Blood  ?Result Value Ref Range Status  ? Varicella-Zoster, PCR Negative Negative  Final  ?  Comment: (NOTE) ?No Varicella Zoster Virus DNA detected. ?This test was developed and its performance characteristics ?determined by [facility].  It has not been cleared or approved by the ?Food and Drug Administration.  The FDA has determined that such ?clearance or approval is not necessary. ?Performed At: [facility] ?[address] [place], [place] [id number] ?Jolene Schimke MD VZ:[id number] ?  ?Gastrointestinal Panel by PCR , Stool     Status: None  ? Collection Time: 11/24/21 11:20 AM  ? Specimen: Stool  ?Result Value Ref Range Status  ? Campylobacter species NOT DETECTED NOT DETECTED Final  ? Plesimonas shigelloides NOT DETECTED NOT DETECTED Final  ? Salmonella species NOT DETECTED NOT DETECTED Final  ? Yersinia enterocolitica NOT DETECTED NOT DETECTED Final  ? Vibrio species NOT DETECTED NOT DETECTED Final  ? Vibrio cholerae NOT DETECTED NOT DETECTED Final  ? Enteroaggregative E coli (EAEC) NOT DETECTED NOT DETECTED Final  ? Enteropathogenic E coli (EPEC) NOT DETECTED NOT DETECTED Final  ? Enterotoxigenic E coli (ETEC) NOT DETECTED NOT DETECTED Final  ? Shiga like toxin producing E coli (STEC) NOT DETECTED NOT DETECTED Final  ? Shigella/Enteroinvasive E coli (EIEC) NOT DETECTED NOT DETECTED Final  ? Cryptosporidium NOT DETECTED NOT DETECTED Final  ? Cyclospora cayetanensis NOT DETECTED NOT DETECTED Final  ? Entamoeba histolytica NOT DETECTED NOT DETECTED Final  ? Giardia lamblia NOT DETECTED NOT DETECTED Final  ?  Adenovirus F40/41 NOT DETECTED NOT DETECTED Final  ? Astrovirus NOT DETECTED NOT DETECTED Final  ? Norovirus GI/GII NOT DETECTED NOT DETECTED Final  ? Rotavirus A NOT DETECTED NOT DETECTED Final  ? Sapovirus (I, II, IV, and V) NOT DETECTED NOT DETECTED Final  ?  Comment: Performed at [facility], [address], [place], [place] [address]  ?C Difficile Quick Screen w PCR reflex     Status: Abnormal  ? Collection Time: 11/24/21 11:20 AM  ? Specimen: STOOL  ?Result Value Ref  Range Status  ? C Diff antigen POSITIVE (A) NEGATIVE Final  ? C Diff toxin POSITIVE (A) NEGATIVE Final  ? C Diff interpretation Toxin producing C. difficile detected.  Final  ?  Comment: CRITICAL RESULT CALLED

## 2021-11-27 LAB — EPSTEIN BARR VRS(EBV DNA BY PCR): EBV DNA QN by PCR: POSITIVE [IU]/mL

## 2021-11-29 ENCOUNTER — Telehealth: Payer: Self-pay

## 2021-11-29 NOTE — Telephone Encounter (Signed)
Attempted to contact pt .Number not valid was recording subscriber not in service. If you fell this received message is an error hang up and try call again later. Also sent msg to pt through mychart. ?

## 2021-11-30 ENCOUNTER — Telehealth: Payer: Self-pay

## 2021-11-30 NOTE — Telephone Encounter (Signed)
Attempt #2 to contact pt regarding positive test results for Hep C that was done at [facility] on 4/4. Recording states subscriber not in service try again later. ?

## 2021-12-01 ENCOUNTER — Telehealth: Payer: Self-pay

## 2021-12-01 NOTE — Telephone Encounter (Signed)
3rd attempt to contact pt . When call number listed [phone] subscriber no longer in service. Mailed letter to pt on 11/30/21 regarding request to speak with pt r/t +Hep C results done at [facility] 11/23/21. No response from mychart message sent on 11/29/21. ?

## 2021-12-07 NOTE — Progress Notes (Signed)
Inform patient that she needs to follow up - one of the blood tests was abnormal - if not reachable send letter

## 2021-12-22 ENCOUNTER — Ambulatory Visit (HOSPITAL_COMMUNITY)
Admission: EM | Admit: 2021-12-22 | Discharge: 2021-12-22 | Disposition: A | Discharge status: Home / Self Care | Payer: Medicaid Other | Attending: Family Medicine | Admitting: Family Medicine

## 2021-12-22 ENCOUNTER — Encounter (HOSPITAL_COMMUNITY): Payer: Self-pay

## 2021-12-22 DIAGNOSIS — B1920 Unspecified viral hepatitis C without hepatic coma: Secondary | ICD-10-CM | POA: Diagnosis not present

## 2021-12-22 DIAGNOSIS — N898 Other specified noninflammatory disorders of vagina: Secondary | ICD-10-CM | POA: Insufficient documentation

## 2021-12-22 LAB — POCT URINALYSIS DIPSTICK, ED / UC
Bilirubin Urine: NEGATIVE
Glucose, UA: NEGATIVE mg/dL
Hgb urine dipstick: NEGATIVE
Ketones, ur: NEGATIVE mg/dL
Leukocytes,Ua: NEGATIVE
Nitrite: NEGATIVE
Protein, ur: NEGATIVE mg/dL
Specific Gravity, Urine: 1.025 (ref 1.005–1.030)
Urobilinogen, UA: 0.2 mg/dL (ref 0.0–1.0)
pH: 7 (ref 5.0–8.0)

## 2021-12-22 MED ORDER — METRONIDAZOLE 500 MG PO TABS: 500.0000 mg | ORAL_TABLET | Freq: Two times a day (BID) | ORAL | 0 refills | Status: DC

## 2021-12-22 NOTE — ED Provider Notes (Signed)
?[facility] ? ? ? ?CSN: [id number] ?Arrival date & time: 12/22/21  1405 ? ? ?  ? ?History   ?Chief Complaint ?No chief complaint on file. ? ? ?HPI ?Natasha Riddle is a 36 y.o. female.  ? ?She was just released from jail yesterday.  ?She thinks she has BV from using state soap.   ?She is having excessive d/c grayish color, turning green.  Foul odor.  No itching.  ?Going on several weeks, since she started using the state soap.  ?No risk for STDs.  ? ?She does have a h/o IV drug use.  Was in the hospital for a while and had issues with her liver.  She had to leave the hospital, and then to jail, and never followed up on these results.  ?Reviewed labs.  ?+ c diff in April, never treated.  No further symptoms at this time.  ?Her  hep C is positive, she was unaware of this.  ? ?Past Medical History:  ?Diagnosis Date  ? Depression   ? ? ?Patient Active Problem List  ? Diagnosis Date Noted  ? Heroin abuse (HCC) 11/24/2021  ? Iron deficiency anemia due to chronic blood loss 11/24/2021  ? Clostridium difficile diarrhea 11/24/2021  ? Acute hepatitis 11/23/2021  ? PTSD (post-traumatic stress disorder) 06/26/2019  ? 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 Fibromyalgia 01/07/2019  ? Bipolar I disorder, most recent episode depressed (HCC) 01/07/2019  ? Herpes 12/02/2016  ? ? ?Past Surgical History:  ?Procedure Laterality Date  ? TONSILLECTOMY    ? TUBAL LIGATION N/A 06/10/2017  ? Procedure: POST PARTUM TUBAL LIGATION;  Surgeon: Nadara MustardHarris, Robert P, MD;  Location: [facility] ORS;  Service: Gynecology;  Laterality: N/A;  ? ? ?OB History   ? ? Gravida  ?5  ? Para  ?3  ? Term  ?3  ? Preterm  ?   ? AB  ?2  ? Living  ?3  ?  ? ? SAB  ?2  ? IAB  ?   ? Ectopic  ?   ? Multiple  ?0  ? Live Births  ?3  ?   ?  ?  ? ? ? ?Home Medications   ? ?Prior to Admission medications   ?Medication Sig Start Date End Date Taking? Authorizing Provider  ?DULoxetine (CYMBALTA) 20 MG capsule Take 1 capsule (20 mg total) by mouth daily. ?Patient not  taking: Reported on 11/24/2021 01/11/19   Clapacs, Jackquline DenmarkJohn T, MD  ?hydrOXYzine (ATARAX/VISTARIL) 50 MG tablet Take 1 tablet (50 mg total) by mouth every 6 (six) hours as needed for anxiety. ?Patient not taking: Reported on 11/24/2021 01/10/19   Clapacs, Jackquline DenmarkJohn T, MD  ?lithium 600 MG capsule Take 1 capsule (600 mg total) by mouth at bedtime. ?Patient not taking: Reported on 11/24/2021 01/10/19   Clapacs, Jackquline DenmarkJohn T, MD  ?metroNIDAZOLE (FLAGYL) 500 MG tablet Take 1 tablet (500 mg total) by mouth every 12 (twelve) hours. ?Patient not taking: Reported on 11/24/2021 01/10/19   Clapacs, Jackquline DenmarkJohn T, MD  ?predniSONE (STERAPRED UNI-PAK 21 TAB) 10 MG (21) TBPK tablet Take 6 pills on day one then decrease by 1 pill each day ?Patient not taking: Reported on 11/24/2021 01/24/19   Faythe GheeFisher, Susan W, PA-C  ?pregabalin (LYRICA) 25 MG capsule Take 1 capsule (25 mg total) by mouth 2 (two) times daily. ?Patient not taking: Reported on 11/24/2021 01/10/19   Clapacs, Jackquline DenmarkJohn T, MD  ?QUEtiapine (SEROQUEL) 200 MG tablet Take 1 tablet (200 mg total) by mouth  at bedtime. ?Patient not taking: Reported on 11/24/2021 01/10/19   Clapacs, Jackquline Denmark, MD  ?traZODone (DESYREL) 100 MG tablet Take 1 tablet (100 mg total) by mouth at bedtime as needed for sleep. ?Patient not taking: Reported on 11/24/2021 01/10/19   Clapacs, Jackquline Denmark, MD  ? ? ?Family History ?Family History  ?Problem Relation Age of Onset  ? Bipolar disorder Mother   ? Ovarian cysts Mother   ? Asthma Father   ? Asthma Son   ? Hypothyroidism Maternal Aunt   ? Hypertension Maternal Grandfather   ? Diabetes Maternal Grandfather   ? Cancer Maternal Grandfather   ? Hypertension Paternal Grandfather   ? Heart attack Paternal Grandfather   ? Stroke Paternal Grandfather   ? ? ?Social History ?Social History  ? ?Tobacco Use  ? Smoking status: Every Day  ?  Packs/day: 0.50  ?  Years: 20.00  ?  Pack years: 10.00  ?  Types: Cigarettes  ? Smokeless tobacco: Never  ?Substance Use Topics  ? Alcohol use: Not Currently  ? Drug use: Yes  ?  Types:  Methamphetamines  ? ? ? ?Allergies   ?Patient has no known allergies. ? ? ?Review of Systems ?Review of Systems  ?Constitutional: Negative.   ?HENT: Negative.    ?Respiratory: Negative.    ?Cardiovascular: Negative.   ?Gastrointestinal: Negative.   ?Endocrine: Negative.   ?Genitourinary: Negative.   ?Neurological: Negative.   ? ? ?Physical Exam ?Triage Vital Signs ?ED Triage Vitals  ?Enc Vitals Group  ?   BP 12/22/21 1430 118/69  ?   Pulse Rate 12/22/21 1430 88  ?   Resp 12/22/21 1430 18  ?   Temp 12/22/21 1430 98.2 ?F (36.8 ?C)  ?   Temp src --   ?   SpO2 12/22/21 1430 99 %  ?   Weight --   ?   Height --   ?   Head Circumference --   ?   Peak Flow --   ?   Pain Score 12/22/21 1427 3  ?   Pain Loc --   ?   Pain Edu? --   ?   Excl. in GC? --   ? ?No data found. ? ?Updated Vital Signs ?BP 118/69 (BP Location: Right Arm)   Pulse 88   Temp 98.2 ?F (36.8 ?C)   Resp 18   LMP 12/21/2021 (Approximate)   SpO2 99%  ? ?Visual Acuity ?Right Eye Distance:   ?Left Eye Distance:   ?Bilateral Distance:   ? ?Right Eye Near:   ?Left Eye Near:    ?Bilateral Near:    ? ?Physical Exam ?Constitutional:   ?   Appearance: Normal appearance.  ?Cardiovascular:  ?   Rate and Rhythm: Normal rate.  ?Pulmonary:  ?   Effort: Pulmonary effort is normal.  ?Skin: ?   General: Skin is warm.  ?Neurological:  ?   General: No focal deficit present.  ?   Mental Status: She is alert.  ?Psychiatric:     ?   Mood and Affect: Mood normal.  ? ? ? ?UC Treatments / Results  ?Labs ?(all labs ordered are listed, but only abnormal results are displayed) ?Labs Reviewed  ?POCT URINALYSIS DIPSTICK, ED / UC  ? ? ?EKG ? ? ?Radiology ?No results found. ? ?Procedures ?Procedures (including critical care time) ? ?Medications Ordered in UC ?Medications - No data to display ? ?Initial Impression / Assessment and Plan / UC Course  ?I have reviewed the  triage vital signs and the nursing notes. ? ?Pertinent labs & imaging results that were available during my care of the  patient were reviewed by me and considered in my medical decision making (see chart for details). ? ?  ?Final Clinical Impressions(s) / UC Diagnoses  ? ?Final diagnoses:  ?Vaginal discharge  ?Hepatitis C virus infection without hepatic coma, unspecified chronicity  ? ? ? ?Discharge Instructions   ? ?  ?You were seen today for vaginal discharge.  ?I have sent out flagyl to treat BV.  The vaginal swab will be resulted tomorrow.  We will call and notify you if there is anything abnormal that needs to be treated.  ?Your lab work from last month shows elevated liver enzymes, and positive hepatitis C antibody.  You need to see a gastroenterologist for his.  Please call [phone] to make an appointment.  ? ? ? ?ED Prescriptions   ? ? Medication Sig Dispense Auth. Provider  ? metroNIDAZOLE (FLAGYL) 500 MG tablet Take 1 tablet (500 mg total) by mouth 2 (two) times daily. 14 tablet Jannifer Franklin, MD  ? ?  ? ?PDMP not reviewed this encounter. ?  Jannifer Franklin, MD ?12/22/21 1507 ? ?

## 2021-12-22 NOTE — ED Triage Notes (Signed)
Pt states she was released from jail yesterday. Pt's states urinary frequency, dysuria, and vaginal discharge for the past week. ?

## 2021-12-22 NOTE — Discharge Instructions (Addendum)
You were seen today for vaginal discharge.  ?I have sent out flagyl to treat BV.  The vaginal swab will be resulted tomorrow.  We will call and notify you if there is anything abnormal that needs to be treated.  ?Your lab work from last month shows elevated liver enzymes, and positive hepatitis C antibody.  You need to see a gastroenterologist for his.  Please call [phone] to make an appointment.  ?

## 2021-12-23 LAB — CERVICOVAGINAL ANCILLARY ONLY
Bacterial Vaginitis (gardnerella): POSITIVE — AB
Candida Glabrata: NEGATIVE
Candida Vaginitis: NEGATIVE
Chlamydia: NEGATIVE
Comment: NEGATIVE
Comment: NEGATIVE
Comment: NEGATIVE
Comment: NEGATIVE
Comment: NEGATIVE
Comment: NORMAL
Neisseria Gonorrhea: NEGATIVE
Trichomonas: POSITIVE — AB

## 2021-12-28 ENCOUNTER — Encounter (HOSPITAL_COMMUNITY): Payer: Self-pay

## 2021-12-28 ENCOUNTER — Ambulatory Visit (HOSPITAL_COMMUNITY)
Admission: EM | Admit: 2021-12-28 | Discharge: 2021-12-28 | Disposition: A | Discharge status: Home / Self Care | Payer: Medicaid Other | Attending: Internal Medicine | Admitting: Internal Medicine

## 2021-12-28 DIAGNOSIS — H5712 Ocular pain, left eye: Secondary | ICD-10-CM

## 2021-12-28 DIAGNOSIS — H00015 Hordeolum externum left lower eyelid: Secondary | ICD-10-CM

## 2021-12-28 MED ORDER — IBUPROFEN 600 MG PO TABS: 600.0000 mg | ORAL_TABLET | Freq: Four times a day (QID) | ORAL | 0 refills | Status: DC | PRN

## 2021-12-28 MED ORDER — ACETAMINOPHEN 500 MG PO TABS: 500.0000 mg | ORAL_TABLET | Freq: Four times a day (QID) | ORAL | 0 refills | Status: DC | PRN

## 2021-12-28 MED ORDER — GENTAMICIN SULFATE 0.3 % OP SOLN: 2.0000 [drp] | OPHTHALMIC | 0 refills | Status: DC

## 2021-12-28 MED ORDER — GENTAMICIN SULFATE 0.3 % OP SOLN: 2.0000 [drp] | OPHTHALMIC | 0 refills | Status: AC

## 2021-12-28 MED ORDER — ACETAMINOPHEN 325 MG PO TABS
ORAL_TABLET | ORAL | Status: AC
  Filled 2021-12-28: qty 3

## 2021-12-28 MED ORDER — ACETAMINOPHEN 325 MG PO TABS
975.0000 mg | ORAL_TABLET | Freq: Once | ORAL | Status: AC
  Administered 2021-12-28: 975 mg via ORAL

## 2021-12-28 NOTE — Discharge Instructions (Addendum)
You are seen today for your stye to your left eye.  We gave you Tylenol in the clinic today, so your next dose of Tylenol may be at 10 PM tonight in 6 hours.  You may also take ibuprofen tonight at 9 PM since you received it at 3 PM last.  I have given you prescriptions for both of these medications so that you may take them every 6 hours at home for your eye and head pain. ? ?I would like for you to place 2 drops of gentamicin antibiotic eyedrop in your left eye every 4 hours for the next 7 days to prophylactically treat for infection to your left eye. ? ?I have included information of an ophthalmologist for you on your discharge paperwork.  Please call this eye doctor to schedule an appointment as soon as possible for further evaluation of your stye.  I expect your stye to heal in the next 2 to 3 weeks on its own with supportive care.  Continue to use warm compresses along with the antibiotic eyedrops I have prescribed.  If your stye does not get better in the next 2 weeks, please go to the eye doctor and have this drained.  We do not drain styes at urgent care. ? ?If you develop any new or worsening symptoms or do not improve in the next 2 to 3 days, please return.  If your symptoms are severe, please go to the emergency room.  Follow-up with your primary care provider for further evaluation and management of your symptoms as well as ongoing wellness visits.  I hope you feel better! ?

## 2021-12-28 NOTE — ED Triage Notes (Signed)
2 days ago, Pt reports that she woke up with left eyelid swelling and pain. Has been using warm compresses. No decrease in visual acuity. ?

## 2021-12-28 NOTE — ED Provider Notes (Signed)
?[place] ? ? ? ?CSN: [id number] ?Arrival date & time: 12/28/21  1347 ? ? ?  ? ?History   ?Chief Complaint ?Chief Complaint  ?Patient presents with  ? Eyelid Problem  ?  L  ? ? ?HPI ?Natasha Riddle is a 36 y.o. female.  ? ?Patient presents urgent care with complaint of left eye pain that has been going on for the last 2 days.  She mowed the lawn 3 days ago and noticed mild pain to her left eye.  The next day, the eye pain became worse and she noticed swelling to her lower left eyelid to the inner canthus.  Yesterday, she noticed the swelling and pain become much worse and developed a headache, white drainage from the eye, and pain that radiates to her left ear. Headache is localized to the left side of her face and she currently rates it at a 7 on a scale of 0-10. Her pain is towards the front of her face and she denies pain that is deep into her head behind her eye. She states "it feels like someone hit me with a baseball bat to the left side of my face". She denies sore throat, difficulty swallowing, blurry vision and decreased visual acuity.  She wears glasses and does not wear contacts.  Denies scratching her eye or any foreign body to her eye.  She has never had a stye before and does not have a fever.  She has applied warm compresses to her left eye with temporary relief of pain but no relief of swelling at home.  She is also taken ibuprofen for the last couple of days with minimal relief of eye pain and head pain.  Denies any other aggravating or relieving factors. ? ? ? ?Past Medical History:  ?Diagnosis Date  ? Depression   ? ? ?Patient Active Problem List  ? Diagnosis Date Noted  ? Heroin abuse ([place]) 11/24/2021  ? Iron deficiency anemia due to chronic blood loss 11/24/2021  ? Clostridium difficile diarrhea 11/24/2021  ? Acute hepatitis 11/23/2021  ? PTSD (post-traumatic stress disorder) 06/26/2019  ? 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 Fibromyalgia 01/07/2019  ? Bipolar I  disorder, most recent episode depressed ([place]) 01/07/2019  ? Herpes 12/02/2016  ? ? ?Past Surgical History:  ?Procedure Laterality Date  ? TONSILLECTOMY    ? TUBAL LIGATION N/A 06/10/2017  ? Procedure: POST PARTUM TUBAL LIGATION;  Surgeon: Gae Dry, MD;  Location: [facility] ORS;  Service: Gynecology;  Laterality: N/A;  ? ? ?OB History   ? ? Gravida  ?5  ? Para  ?3  ? Term  ?3  ? Preterm  ?   ? AB  ?2  ? Living  ?3  ?  ? ? SAB  ?2  ? IAB  ?   ? Ectopic  ?   ? Multiple  ?0  ? Live Births  ?3  ?   ?  ?  ? ? ? ?Home Medications   ? ?Prior to Admission medications   ?Medication Sig Start Date End Date Taking? Authorizing Provider  ?acetaminophen (TYLENOL) 500 MG tablet Take 1 tablet (500 mg total) by mouth every 6 (six) hours as needed. 12/28/21  Yes Talbot Grumbling, FNP  ?gentamicin (GARAMYCIN) 0.3 % ophthalmic solution Place 2 drops into the left eye every 4 (four) hours for 7 days. 12/28/21 01/04/22 Yes Talbot Grumbling, FNP  ?ibuprofen (ADVIL) 600 MG tablet Take 1 tablet (600 mg total) by  mouth every 6 (six) hours as needed. 12/28/21  Yes Carlisle Beers, FNP  ?metroNIDAZOLE (FLAGYL) 500 MG tablet Take 1 tablet (500 mg total) by mouth 2 (two) times daily. 12/22/21   Jannifer Franklin, MD  ? ? ?Family History ?Family History  ?Problem Relation Age of Onset  ? Bipolar disorder Mother   ? Ovarian cysts Mother   ? Asthma Father   ? Asthma Son   ? Hypothyroidism Maternal Aunt   ? Hypertension Maternal Grandfather   ? Diabetes Maternal Grandfather   ? Cancer Maternal Grandfather   ? Hypertension Paternal Grandfather   ? Heart attack Paternal Grandfather   ? Stroke Paternal Grandfather   ? ? ?Social History ?Social History  ? ?Tobacco Use  ? Smoking status: Every Day  ?  Packs/day: 0.50  ?  Years: 20.00  ?  Pack years: 10.00  ?  Types: Cigarettes  ? Smokeless tobacco: Never  ?Substance Use Topics  ? Alcohol use: Not Currently  ? Drug use: Yes  ?  Types: Methamphetamines  ? ? ? ?Allergies   ?Patient has no known  allergies. ? ? ?Review of Systems ?Review of Systems ?Per HPI ? ?Physical Exam ?Triage Vital Signs ?ED Triage Vitals [12/28/21 1516]  ?Enc Vitals Group  ?   BP 121/73  ?   Pulse Rate 89  ?   Resp 18  ?   Temp 98.4 ?F (36.9 ?C)  ?   Temp Source Oral  ?   SpO2 98 %  ?   Weight   ?   Height   ?   Head Circumference   ?   Peak Flow   ?   Pain Score 6  ?   Pain Loc   ?   Pain Edu?   ?   Excl. in GC?   ? ?No data found. ? ?Updated Vital Signs ?BP 121/73 (BP Location: Left Arm)   Pulse 89   Temp 98.4 ?F (36.9 ?C) (Oral)   Resp 18   LMP 12/21/2021 (Approximate)   SpO2 98%  ? ?Visual Acuity ?Right Eye Distance: 20/30 ?Left Eye Distance: 20/30 ?Bilateral Distance: 20/25 ? ?Right Eye Near:   ?Left Eye Near:    ?Bilateral Near:    ? ?Physical Exam ?Vitals and nursing note reviewed.  ?Constitutional:   ?   General: She is not in acute distress. ?   Appearance: She is well-developed.  ?HENT:  ?   Head: Normocephalic and atraumatic.  ?   Right Ear: Tympanic membrane, ear canal and external ear normal. There is no impacted cerumen.  ?   Left Ear: Tympanic membrane, ear canal and external ear normal. There is no impacted cerumen.  ?   Nose: Nose normal.  ?   Mouth/Throat:  ?   Mouth: Mucous membranes are moist.  ?   Pharynx: No oropharyngeal exudate or posterior oropharyngeal erythema.  ?Eyes:  ?   General: Vision grossly intact. Gaze aligned appropriately. No allergic shiner, visual field deficit or scleral icterus.    ?   Right eye: No foreign body, discharge or hordeolum.     ?   Left eye: Discharge and hordeolum present.No foreign body.  ?   Extraocular Movements: Extraocular movements intact.  ?   Conjunctiva/sclera: Conjunctivae normal.  ?   Right eye: Right conjunctiva is not injected.  ?   Left eye: Left conjunctiva is not injected.  ?   Comments: Small amount of white discharge noted to left eye. Hordeolum  to inner canthus of lower eyelid that is moderately swollen, fluctuant, and painful to palpation. Conjunctiva are  not injected.   ?Cardiovascular:  ?   Rate and Rhythm: Normal rate and regular rhythm.  ?   Heart sounds: No murmur heard. ?Pulmonary:  ?   Effort: Pulmonary effort is normal. No respiratory distress.  ?   Breath sounds: Normal breath sounds.  ?Abdominal:  ?   Palpations: Abdomen is soft.  ?   Tenderness: There is no abdominal tenderness.  ?Musculoskeletal:     ?   General: No swelling.  ?   Cervical back: Neck supple.  ?Skin: ?   General: Skin is warm and dry.  ?   Capillary Refill: Capillary refill takes less than 2 seconds.  ?Neurological:  ?   General: No focal deficit present.  ?   Mental Status: She is alert and oriented to person, place, and time. Mental status is at baseline.  ?   Motor: No weakness.  ?   Gait: Gait normal.  ?Psychiatric:     ?   Mood and Affect: Mood normal.     ?   Behavior: Behavior normal.     ?   Thought Content: Thought content normal.     ?   Judgment: Judgment normal.  ? ? ? ?UC Treatments / Results  ?Labs ?(all labs ordered are listed, but only abnormal results are displayed) ?Labs Reviewed - No data to display ? ?EKG ? ? ?Radiology ?No results found. ? ?Procedures ?Procedures (including critical care time) ? ?Medications Ordered in UC ?Medications  ?acetaminophen (TYLENOL) tablet 975 mg (975 mg Oral Given 12/28/21 1556)  ? ? ?Initial Impression / Assessment and Plan / UC Course  ?I have reviewed the triage vital signs and the nursing notes. ? ?Pertinent labs & imaging results that were available during my care of the patient were reviewed by me and considered in my medical decision making (see chart for details). ? ?Patient is a 36 year old female presenting today with a hordeolum to her left eye.  Her visual acuity is intact and there is no concern for need for emergent evaluation of her left eye.  Her neurologic exam is also intact.  No xanthelasma, chalazion, conjunctivitis or other lesions noted to her eyes bilaterally.  Doubt periorbital cellulitis at this time.  Patient's  subjective eye pain is anterior.  Plan to treat with gentamicin eyedrops prophylactically and have patient follow-up with an ophthalmologist as soon as possible for further evaluation. Suspect that hordeolum will heal on its

## 2021-12-29 ENCOUNTER — Encounter (HOSPITAL_COMMUNITY): Payer: Self-pay

## 2022-06-15 LAB — HSV DNA BY PCR (REFERENCE LAB)
HSV 1 DNA: NEGATIVE
HSV 2 DNA: NEGATIVE

## 2023-12-01 IMAGING — US US ABDOMEN LIMITED
1 series · 15 of 25 positions shown · non-contrast
Comparison: None.

CLINICAL DATA: Transaminitis

EXAM:
ULTRASOUND ABDOMEN LIMITED RIGHT UPPER QUADRANT

[Series 1: us abdomen limited ruq · 15 of 43 slices shown]
[im 1/43]
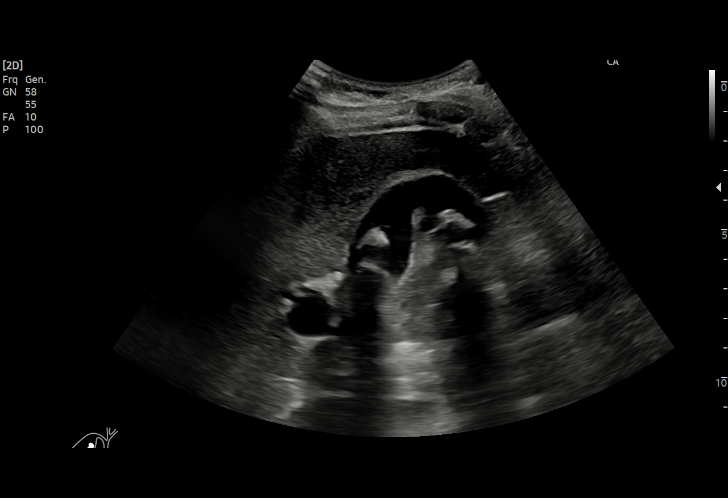
[im 4/43]
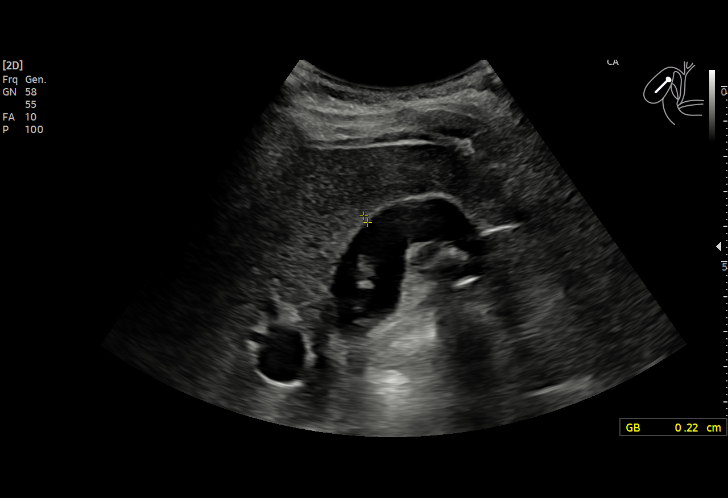
[im 8/43]
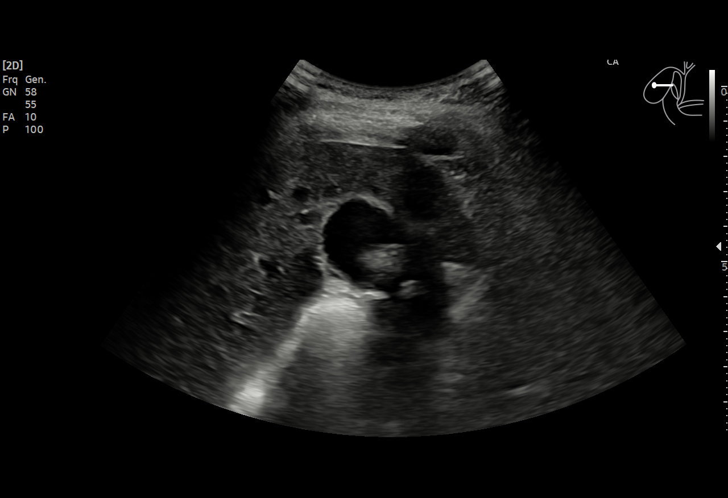
[im 9/43]
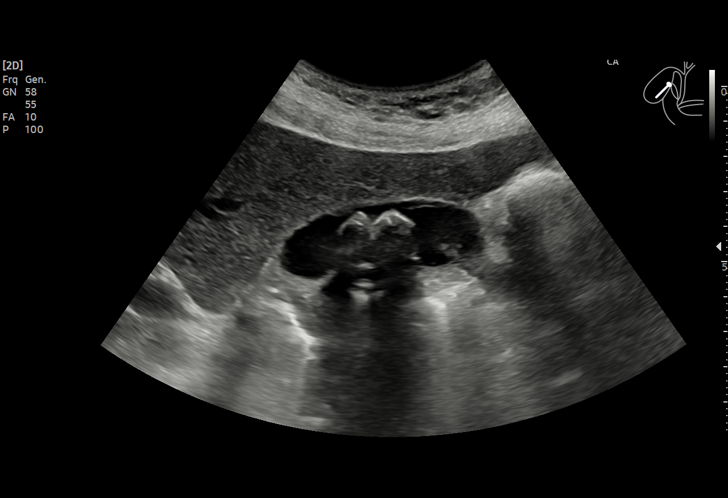
[im 13/43]
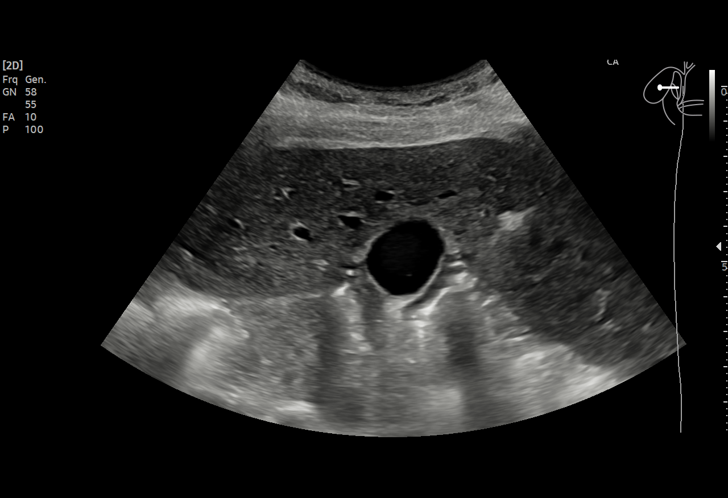
[im 16/43]
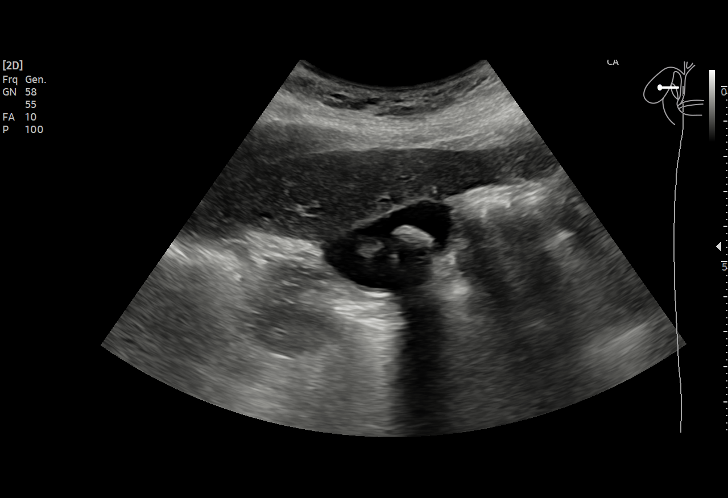
[im 18/43]
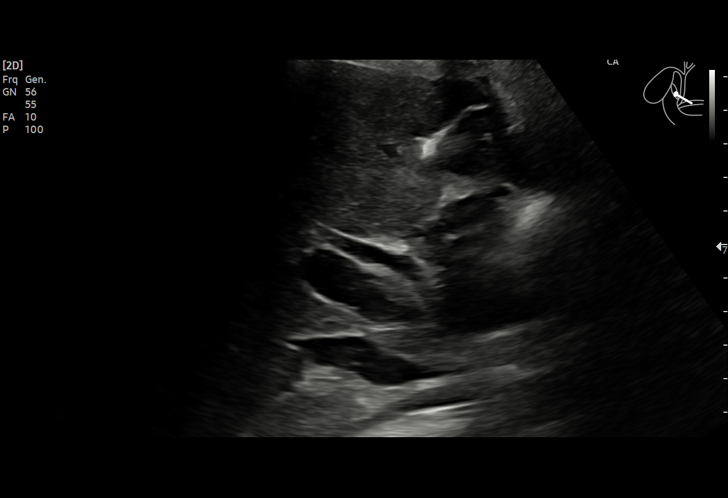
[im 22/43]
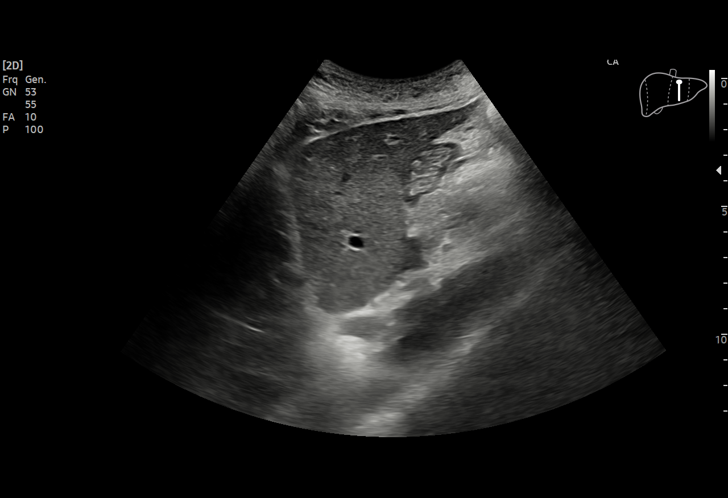
[im 25/43]
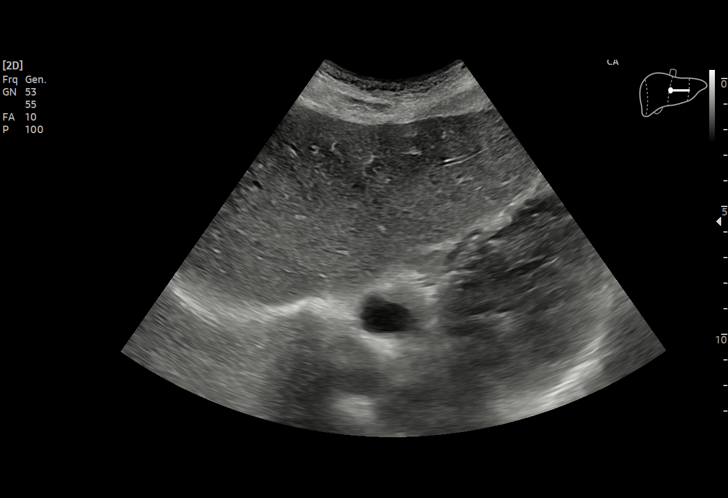
[im 27/43]
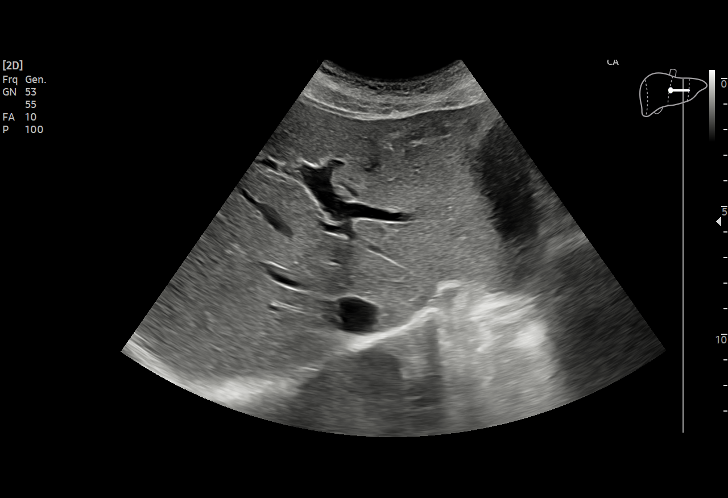
[im 30/43]
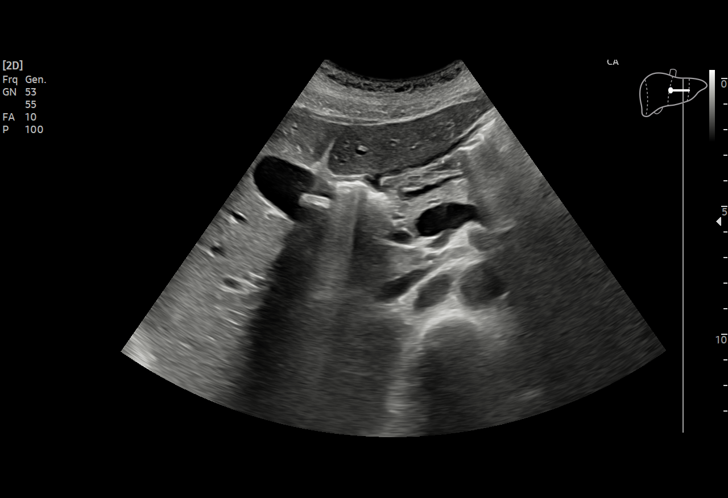
[im 34/43]
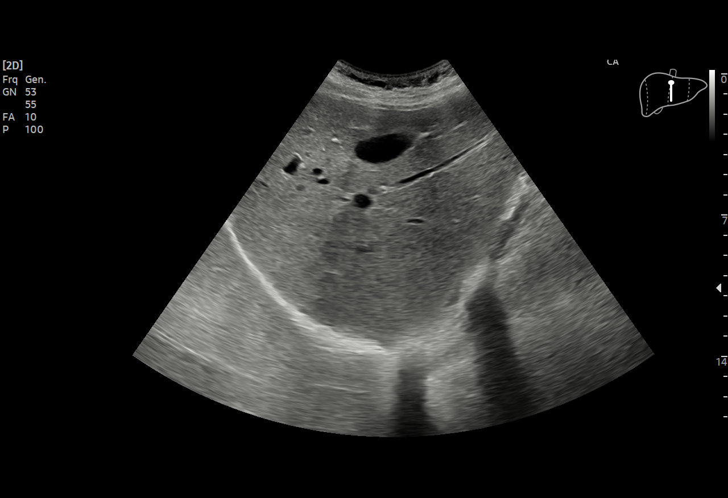
[im 36/43]
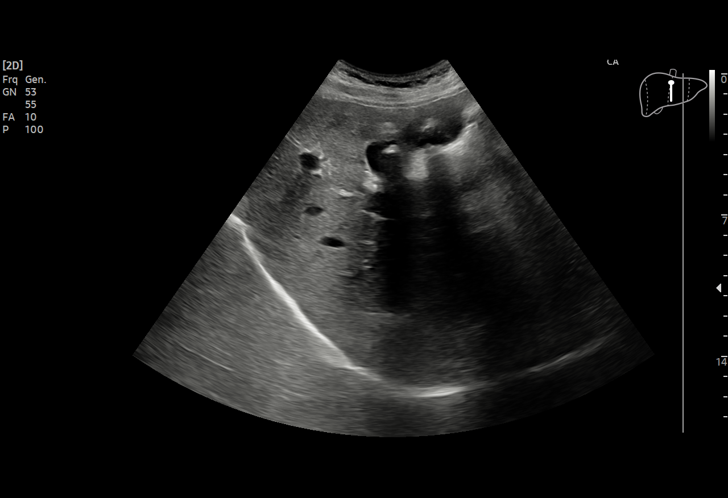
[im 39/43]
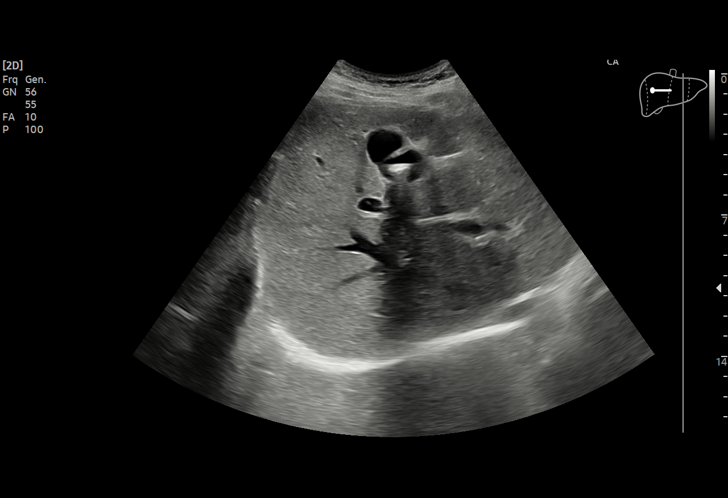
[im 43/43]
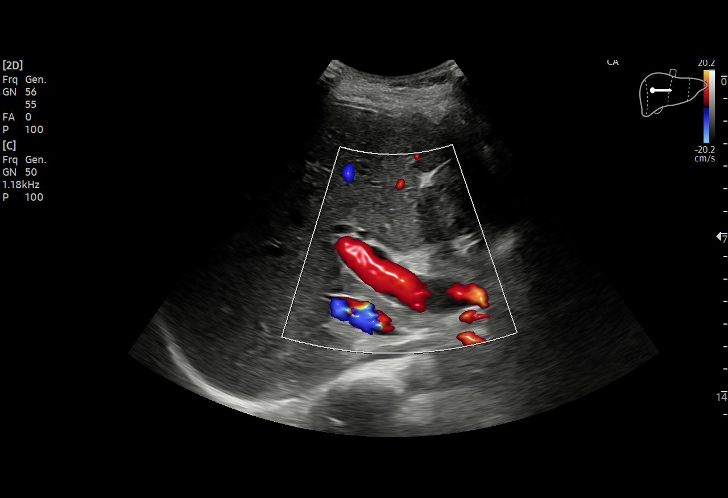

[15 of 25 positions shown; findings below may reference images not displayed]

FINDINGS: Gallbladder:

Multiple gallstones within the gallbladder measuring up to 1.5 cm.
No wall thickening or sonographic Murphy sign.

Common bile duct:

Diameter: Normal caliber, 5 mm

Liver:

No focal lesion identified. Within normal limits in parenchymal
echogenicity. Portal vein is patent on color Doppler imaging with
normal direction of blood flow towards the liver.

Other: None.
IMPRESSION: Cholelithiasis.  No sonographic evidence of acute cholecystitis.

No acute findings.

## 2023-12-11 ENCOUNTER — Ambulatory Visit: Payer: Self-pay

## 2023-12-11 NOTE — Telephone Encounter (Signed)
 Chief Complaint: Rash on face Symptoms: rash with white pus on face Frequency: "a few months" Pertinent Negatives: Patient denies fever Disposition: [] ED /[x] Urgent Care (no appt availability in office) / [] Appointment(In office/virtual)/ []  [facility]/ [] Home Care/ [] Refused Recommended Disposition /[] [facility]/ []  Follow-up with PCP Additional Notes: Patient called in stating she was looking to get established with new PCP for further healthcare needs. Patient states currently she is experiencing a rash on the side of her face that are tiny red dots with white pus that is unresolving. Patient advised to go to UC within 24 hours for evaluation and to rule out infection. Patient assisted with new patient appt.    Copied from CRM [id number]. Topic: Clinical - Red Word Triage >> Dec 11, 2023 10:44 AM [place] A wrote: [facility] that prompted transfer to Nurse Triage: Patient has rash that has been going on for a month or more. There are red marks from her cheek to chin with small pustules that are itchy. Patient would like to become new patient for [facility] but has red word Reason for Disposition  [1] Looks infected (spreading redness, pus) AND [2] no fever  Answer Assessment - Initial Assessment Questions 1. APPEARANCE of RASH: "Describe the rash."      Tiniest little red spots with white pus bumps 2. LOCATION: "Where is the rash located?"      On side of face from cheek to chin 3. NUMBER: "How many spots are there?"      Too many to count 4. SIZE: "How big are the spots?" (Inches, centimeters or compare to size of a coin)      Very small  5. ONSET: "When did the rash start?"      A few months ago 6. ITCHING: "Does the rash itch?" If Yes, ask: "How bad is the itch?"  (Scale 0-10; or none, mild, moderate, severe)     Yes  - Mild 7. PAIN: "Does the rash hurt?" If Yes, ask: "How bad is the pain?"  (Scale 0-10; or none, mild, moderate, severe)    - NONE (0): no pain     - MILD (1-3): doesn't interfere with normal activities     - MODERATE (4-7): interferes with normal activities or awakens from sleep     - SEVERE (8-10): excruciating pain, unable to do any normal activities     No 8. OTHER SYMPTOMS: "Do you have any other symptoms?" (e.g., fever)     No 9. PREGNANCY: "Is there any chance you are pregnant?" "When was your last menstrual period?"     No  Protocols used: Rash or Redness - Localized-A-AH

## 2023-12-14 ENCOUNTER — Ambulatory Visit (INDEPENDENT_AMBULATORY_CARE_PROVIDER_SITE_OTHER): Payer: MEDICAID | Admitting: Family Medicine

## 2023-12-14 ENCOUNTER — Encounter: Payer: Self-pay | Admitting: Family Medicine

## 2023-12-14 VITALS — BP 111/76 | HR 88 | Temp 98.0°F | Resp 16 | Ht 64.57 in | Wt 172.8 lb

## 2023-12-14 DIAGNOSIS — L700 Acne vulgaris: Secondary | ICD-10-CM

## 2023-12-14 DIAGNOSIS — L918 Other hypertrophic disorders of the skin: Secondary | ICD-10-CM | POA: Diagnosis not present

## 2023-12-14 DIAGNOSIS — F39 Unspecified mood [affective] disorder: Secondary | ICD-10-CM

## 2023-12-14 DIAGNOSIS — N920 Excessive and frequent menstruation with regular cycle: Secondary | ICD-10-CM

## 2023-12-14 DIAGNOSIS — Z8619 Personal history of other infectious and parasitic diseases: Secondary | ICD-10-CM

## 2023-12-14 DIAGNOSIS — F1111 Opioid abuse, in remission: Secondary | ICD-10-CM

## 2023-12-14 DIAGNOSIS — Z7689 Persons encountering health services in other specified circumstances: Secondary | ICD-10-CM

## 2023-12-14 DIAGNOSIS — F909 Attention-deficit hyperactivity disorder, unspecified type: Secondary | ICD-10-CM | POA: Diagnosis not present

## 2023-12-14 MED ORDER — DOXYCYCLINE HYCLATE 100 MG PO TABS: 100.0000 mg | ORAL_TABLET | Freq: Two times a day (BID) | ORAL | 0 refills | Status: DC

## 2023-12-14 NOTE — Progress Notes (Addendum)
 New Patient Office Visit  Subjective   Patient ID: Natasha Riddle, female    DOB: 12/01/1985  Age: 38 y.o. MRN: [id number]  CC:  Chief Complaint  Patient presents with   Establish Care    Pt. Here to establish care.    Rash    Pt. C/o about a rash on the face that was noticed about 3 months ago with a itch.     HPI Ethel Meisenheimer is a 38 year old female who presents to establish with [facility] at [facility].   CC: Patient here to establish care  Last PCP: [facility] in [place], [place]-- Dr. Joann Mu, last Aug/Sept 2024 Specialists: psychiatry- Dr. Evonne Hoist (same office as PCP) History of hepatitis C disease- went to ID to be treated and tested negative recently   BH: sees psychiatry  Takes hydroxyzine  50mg  at bedtime for insomnia, Zoloft 150mg  daily, Suboxone Recovering addict- clean for past 2 years   ADHD: Vyvanse 40mg  daily   Rash- around face, itchiness x3  months  Has been using hydrocortisone cream at night  Denies changing detergents, face wash, etc.   Posterior L thigh- has noticed "a third nipple" that has been present for a few years  Not painful. Increased in size- but not by much  No change to color. No bleeding.      12/14/2023    8:26 AM  GAD 7 : Generalized Anxiety Score  Nervous, Anxious, on Edge 0  Control/stop worrying 0  Worry too much - different things 0  Trouble relaxing 0  Restless 0  Easily annoyed or irritable 0  Afraid - awful might happen 0  Total GAD 7 Score 0  Anxiety Difficulty Not difficult at all       12/14/2023    8:25 AM  PHQ9 SCORE ONLY  PHQ-9 Total Score 3    Outpatient Encounter Medications as of 12/14/2023  Medication Sig   doxycycline  (VIBRA -TABS) 100 MG tablet Take 1 tablet (100 mg total) by mouth 2 (two) times daily.   hydrOXYzine  (ATARAX ) 25 MG tablet Take 25 mg by mouth 3 (three) times daily.   sertraline (ZOLOFT) 100 MG tablet Take 100 mg by mouth daily.   sertraline (ZOLOFT) 50 MG  tablet Take by mouth.   SUBOXONE 8-2 MG FILM Place under the tongue.   valACYclovir (VALTREX) 500 MG tablet Take 500 mg by mouth daily.   VYVANSE 40 MG capsule Take 40 mg by mouth daily.   [DISCONTINUED] acetaminophen  (TYLENOL ) 500 MG tablet Take 1 tablet (500 mg total) by mouth every 6 (six) hours as needed.   [DISCONTINUED] ibuprofen  (ADVIL ) 600 MG tablet Take 1 tablet (600 mg total) by mouth every 6 (six) hours as needed.   [DISCONTINUED] metroNIDAZOLE  (FLAGYL ) 500 MG tablet Take 1 tablet (500 mg total) by mouth 2 (two) times daily.   No facility-administered encounter medications on file as of 12/14/2023.    Patient Active Problem List   Diagnosis Date Noted   PTSD (post-traumatic stress disorder) 06/26/2019   Fibromyalgia 01/07/2019   Herpes 12/02/2016   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   Pt declines use on 06/26/19  States last MJ age 28, last acid as a teen, denies meth use ever     Bipolar I disorder, most recent episode depressed ([facility]) 01/07/2019   Depression    Heroin abuse ([facility]) 11/24/2021   Iron deficiency anemia due to  chronic blood loss 11/24/2021   Past Surgical History:  Procedure Laterality Date   TONSILLECTOMY     TUBAL LIGATION N/A 06/10/2017   Procedure: POST PARTUM TUBAL LIGATION;  Surgeon: Alben Alma, MD;  Location: [facility] ORS;  Service: Gynecology;  Laterality: N/A;   Family History  Problem Relation Age of Onset   Bipolar disorder Mother    Ovarian cysts Mother    Asthma Father    Asthma Son    Hypothyroidism Maternal Aunt    Hypertension Maternal Grandfather    Diabetes Maternal Grandfather    Cancer Maternal Grandfather    Hypertension Paternal Grandfather    Heart attack Paternal Grandfather    Stroke Paternal Grandfather    Social History   Socioeconomic History   Marital status: Single    Spouse name: Not on file   Number of children: Not on file    Years of education: Not on file   Highest education level: Not on file  Occupational History   Not on file  Tobacco Use   Smoking status: Former    Current packs/day: 0.50    Average packs/day: 0.5 packs/day for 20.0 years (10.0 ttl pk-yrs)    Types: Cigarettes   Smokeless tobacco: Never  Vaping Use   Vaping status: Every Day  Substance and Sexual Activity   Alcohol  use: Not Currently   Drug use: Yes    Types: Methamphetamines   Sexual activity: Yes    Birth control/protection: Surgical    Comment: planning tubal ligation  Other Topics Concern   Not on file  Social History Narrative   Not on file   Social Drivers of Health   Financial Resource Strain: Not on file  Food Insecurity: Not on file  Transportation Needs: Not on file  Physical Activity: Not on file  Stress: Not on file  Social Connections: Not on file  Intimate Partner Violence: Not on file   Outpatient Medications Prior to Visit  Medication Sig Dispense Refill   hydrOXYzine  (ATARAX ) 25 MG tablet Take 25 mg by mouth 3 (three) times daily.     sertraline (ZOLOFT) 100 MG tablet Take 100 mg by mouth daily.     sertraline (ZOLOFT) 50 MG tablet Take by mouth.     SUBOXONE 8-2 MG FILM Place under the tongue.     valACYclovir (VALTREX) 500 MG tablet Take 500 mg by mouth daily.     VYVANSE 40 MG capsule Take 40 mg by mouth daily.     acetaminophen  (TYLENOL ) 500 MG tablet Take 1 tablet (500 mg total) by mouth every 6 (six) hours as needed. 30 tablet 0   ibuprofen  (ADVIL ) 600 MG tablet Take 1 tablet (600 mg total) by mouth every 6 (six) hours as needed. 30 tablet 0   metroNIDAZOLE  (FLAGYL ) 500 MG tablet Take 1 tablet (500 mg total) by mouth 2 (two) times daily. 14 tablet 0   No facility-administered medications prior to visit.   No Known Allergies  ROS: see HPI    Objective  Today's Vitals   12/14/23 0817  BP: 111/76  Pulse: 88  Resp: 16  Temp: 98 F (36.7 C)  TempSrc: Oral  SpO2: 98%  Weight: 172 lb 12.8  oz (78.4 kg)  Height: 5' 4.57" (1.64 m)  PainSc: 0-No pain    GENERAL: Well-appearing, in NAD. Well nourished.  SKIN: Pink, warm and dry. No rash, lesion, ulceration, or ecchymoses. Growth present on left posterior thigh that is about 0.17mm in size, circular, uniform  and flesh colored.  Head: Normocephalic. NECK: Trachea midline. Full ROM w/o pain or tenderness. No lymphadenopathy.  EARS: Tympanic membranes are intact, translucent without bulging and without drainage. Appropriate landmarks visualized.  EYES: Conjunctiva clear without exudates. EOMI, PERRL, no drainage present.  NOSE: Septum midline w/o deformity. Nares patent, mucosa pink and non-inflamed w/o drainage. No sinus tenderness.  THROAT: Uvula midline. Oropharynx clear. Tonsils non-inflamed without exudate. Mucous membranes pink and moist.  RESPIRATORY: Chest wall symmetrical. Respirations even and non-labored. Breath sounds clear to auscultation bilaterally.  CARDIAC: S1, S2 present, regular rate and rhythm without murmur or gallops. Peripheral pulses 2+ bilaterally.  MSK: Muscle tone and strength appropriate for age. Joints w/o tenderness, redness, or swelling.  EXTREMITIES: Without clubbing, cyanosis, or edema.  NEUROLOGIC: No motor or sensory deficits. Steady, even gait. C2-C12 intact.  PSYCH/MENTAL STATUS: Alert, oriented x 3. Cooperative, appropriate mood and affect.     Assessment & Plan:   1. Encounter to establish care (Primary) Patient is a 1- year-old female who presents today to establish care with primary care at [facility]. Reviewed the past medical history, family history, social history, surgical history, medications and allergies today- updates made as indicated. Patient has concerns today about a skin tag on the posterior, upper thigh.    2. Mood disorder [facility]) Patient followed by psychiatry, Dr. Evonne Hoist, and is currently taking Zoloft 150mg  daily and hydroxyzine  50mg  at bedtime.   3.  Acrochordon Patient presents today with concerns for a cutaneous growth that has been present for the last few years. Picture placed in chart prior to shave biopsy procedure.  Procedure:  Excision of skin tag  Risks, benefits, and alternatives explained and consent obtained. Time out conducted. Surface prepped with alcohol . 10cc lidocaine  without epinephine infiltrated in a field block. Adequate anesthesia ensured. Area prepped and draped in a sterile fashion. Skin tag excised from skin surface with shave biopsy method. One suture placed.  Dressing applied. Pt stable and advised to call or RTC for continued bleeding, spreading erythema/induration, fevers, or chills. Return in 2 weeks for suture removal.   4. Menorrhagia with regular cycle Patient reports heavy and painful menstrual cycles. Referral sent to OBGYN.  - Ambulatory referral to Obstetrics / Gynecology  5. Acne vulgaris Patient reports rash/outbreak on her lower chin that has been present on/off for the past couple of weeks. Discussed treatment with doxycycline , limited to 3-4 months to minimize risk of resistance.  - doxycycline  (VIBRA -TABS) 100 MG tablet; Take 1 tablet (100 mg total) by mouth 2 (two) times daily.  Dispense: 30 tablet; Refill: 0  6. Attention deficit hyperactivity disorder (ADHD), unspecified ADHD type Followed by psychiatry- Dr. Evonne Hoist. Currently taking Vyvanse 40mg  daily.   7. History of hepatitis C virus infection Patient reports history of hepatitis C and was treated by infectious disease. Reports most recent testing came back negative.   8. History of heroin abuse [place]) Patient is followed by psychiatry- Dr. Evonne Hoist, and is currently taking Suboxone 8-2mg  film sublingual. She has been clean for 2 years and continues to remain clean.   Return in about 2 weeks (around 12/28/2023) for Physical with fasting labs.    Spent 60 minutes on this patient encounter, including preparation, chart  review, procedure, face-to-face counseling with patient and coordination of care, and documentation of encounter.   Wilhelmena Hanson, FNP

## 2023-12-14 NOTE — Patient Instructions (Signed)

## 2023-12-26 ENCOUNTER — Ambulatory Visit

## 2024-01-02 ENCOUNTER — Encounter: Payer: Self-pay | Admitting: Family Medicine

## 2024-01-02 ENCOUNTER — Ambulatory Visit (INDEPENDENT_AMBULATORY_CARE_PROVIDER_SITE_OTHER): Payer: MEDICAID | Admitting: Family Medicine

## 2024-01-02 VITALS — BP 105/72 | HR 81 | Resp 16 | Ht 64.57 in | Wt 164.2 lb

## 2024-01-02 DIAGNOSIS — L700 Acne vulgaris: Secondary | ICD-10-CM

## 2024-01-02 DIAGNOSIS — Z0000 Encounter for general adult medical examination without abnormal findings: Secondary | ICD-10-CM | POA: Diagnosis not present

## 2024-01-02 DIAGNOSIS — K4091 Unilateral inguinal hernia, without obstruction or gangrene, recurrent: Secondary | ICD-10-CM

## 2024-01-02 MED ORDER — CLINDAMYCIN PHOS (ONCE-DAILY) 1 % EX GEL: 1.0000 | Freq: Every day | CUTANEOUS | 3 refills | Status: AC

## 2024-01-02 NOTE — Addendum Note (Signed)
 Addended by: Wilhelmena Hanson on: 01/02/2024 03:20 PM   Modules accepted: Orders

## 2024-01-02 NOTE — Progress Notes (Addendum)
 Subjective:   Natasha Riddle [birth year]  01/02/2024   CC: Chief Complaint  Patient presents with   Follow-up   Annual Exam    HPI: Natasha Riddle is a 38 y.o. female who presents for a routine health maintenance exam.  Fasting labs will be collected in the future- orders placed.    HEALTH SCREENINGS: - Vision Screening: up to date - Dental Visits: plans to schedule  - Pap smear: OBGYN referral placed  - Breast Exam: OBGYN referral placed  - STD Screening: Declined - Mammogram (40+): Not applicable  - Colonoscopy (45+): Not applicable  - Bone Density (65+ or under 65 with predisposing conditions): Not applicable  - Lung CA screening with low-dose CT:  Not applicable Adults age 89-80 who are current cigarette smokers or quit within the last 15 years. Must have 20 pack year history.   Depression and Anxiety Screen done today and results listed below:     01/02/2024    2:24 PM 12/14/2023    8:25 AM  Depression screen PHQ 2/9  Decreased Interest 0 0  Down, Depressed, Hopeless 0 0  PHQ - 2 Score 0 0  Altered sleeping 0 0  Tired, decreased energy 0 3  Change in appetite 0 0  Feeling bad or failure about yourself  0 0  Trouble concentrating 0 0  Moving slowly or fidgety/restless 0 0  Suicidal thoughts 0 0  PHQ-9 Score 0 3  Difficult doing work/chores Not difficult at all Very difficult      01/02/2024    2:25 PM 12/14/2023    8:26 AM  GAD 7 : Generalized Anxiety Score  Nervous, Anxious, on Edge 0 0  Control/stop worrying 0 0  Worry too much - different things 0 0  Trouble relaxing 0 0  Restless 0 0  Easily annoyed or irritable 0 0  Afraid - awful might happen 0 0  Total GAD 7 Score 0 0  Anxiety Difficulty Not difficult at all Not difficult at all    IMMUNIZATIONS: - Tdap: Tetanus vaccination status reviewed: last tetanus booster within 10 years. - HPV: Not applicable - Influenza: Postponed to flu season - Pneumovax: Not applicable - Prevnar 20: Not applicable -  Zostavax (50+): Not applicable  Past medical history, surgical history, medications, allergies, family history and social history reviewed with patient today and changes made to appropriate areas of the chart.   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   Pt declines use on 06/26/19  States last MJ age 64, last acid as a teen, denies meth use ever     Bipolar I disorder, most recent episode depressed (HCC) 01/07/2019   Depression    Heroin abuse (HCC) 11/24/2021   Iron deficiency anemia due to chronic blood loss 11/24/2021    Past Surgical History:  Procedure Laterality Date   TONSILLECTOMY     TUBAL LIGATION N/A 06/10/2017   Procedure: POST PARTUM TUBAL LIGATION;  Surgeon: Alben Alma, MD;  Location: [facility] ORS;  Service: Gynecology;  Laterality: N/A;    Current Outpatient Medications on File Prior to Visit  Medication Sig   doxycycline  (VIBRA -TABS) 100 MG tablet Take 1 tablet (100 mg total) by mouth 2 (two) times daily.   hydrOXYzine  (ATARAX ) 25 MG tablet Take 25 mg by mouth 3 (three) times daily.   sertraline (ZOLOFT) 100 MG tablet Take 100 mg by mouth daily.   sertraline (ZOLOFT) 50 MG  tablet Take by mouth.   SUBOXONE 8-2 MG FILM Place under the tongue.   valACYclovir (VALTREX) 500 MG tablet Take 500 mg by mouth daily.   VYVANSE 40 MG capsule Take 40 mg by mouth daily.   No current facility-administered medications on file prior to visit.    No Known Allergies   Social History   Socioeconomic History   Marital status: Significant Other    Spouse name: Not on file   Number of children: Not on file   Years of education: Not on file   Highest education level: Not on file  Occupational History   Not on file  Tobacco Use   Smoking status: Former    Current packs/day: 0.50    Average packs/day: 0.5 packs/day for 20.0 years (10.0 ttl pk-yrs)    Types: Cigarettes   Smokeless tobacco: Never   Vaping Use   Vaping status: Every Day  Substance and Sexual Activity   Alcohol  use: Not Currently   Drug use: Yes    Types: Methamphetamines   Sexual activity: Yes    Birth control/protection: Surgical    Comment: planning tubal ligation  Other Topics Concern   Not on file  Social History Narrative   Not on file   Social Drivers of Health   Financial Resource Strain: Not on file  Food Insecurity: Not on file  Transportation Needs: Not on file  Physical Activity: Not on file  Stress: Not on file  Social Connections: Not on file  Intimate Partner Violence: Not on file   Social History   Tobacco Use  Smoking Status Former   Current packs/day: 0.50   Average packs/day: 0.5 packs/day for 20.0 years (10.0 ttl pk-yrs)   Types: Cigarettes  Smokeless Tobacco Never   Social History   Substance and Sexual Activity  Alcohol  Use Not Currently    Family History  Problem Relation Age of Onset   Bipolar disorder Mother    Ovarian cysts Mother    Asthma Father    Hypertension Maternal Grandfather    Diabetes Maternal Grandfather    Colon cancer Maternal Grandfather    Hypertension Paternal Grandfather    Heart attack Paternal Grandfather    Stroke Paternal Grandfather    Asthma Son    Hypothyroidism Maternal Aunt    Breast cancer Maternal Aunt      ROS: Denies fever, fatigue, unexplained weight loss/gain, chest pain, SHOB, and palpitations. Denies neurological deficits, gastrointestinal or genitourinary complaints, and skin changes.   Objective:   Today's Vitals   01/02/24 1423  BP: 105/72  Pulse: 81  Resp: 16  SpO2: 96%  Weight: 164 lb 3.2 oz (74.5 kg)  Height: 5' 4.57" (1.64 m)  PainSc: 0-No pain    GENERAL APPEARANCE: Well-appearing, in NAD. Well nourished.  SKIN: Pink, warm and dry. Turgor normal. No rash, lesion, ulceration, or ecchymoses. Hair evenly distributed.  HEENT: HEAD: Normocephalic.  EYES: PERRLA. EOMI. Lids intact w/o defect. Sclera white,  Conjunctiva pink w/o exudate.  EARS: External ear w/o redness, swelling, masses or lesions. EAC clear. TM's intact, translucent w/o bulging, appropriate landmarks visualized. Appropriate acuity to conversational tones.  NOSE: Septum midline w/o deformity. Nares patent, mucosa pink and non-inflamed w/o drainage. No sinus tenderness.  THROAT: Uvula midline. Oropharynx clear. Tonsils non-inflamed w/o exudate. Oral mucosa pink and moist.  NECK: Supple, Trachea midline. Full ROM w/o pain or tenderness. No lymphadenopathy. Thyroid non-tender w/o enlargement or palpable masses.  BREASTS: Deferred.  RESPIRATORY: Chest wall symmetrical w/o masses.  Respirations even and non-labored. Breath sounds clear to auscultation bilaterally. No wheezes, rales, rhonchi, or crackles. CARDIAC: S1, S2 present, regular rate and rhythm. No gallops, murmurs, rubs, or clicks. PMI w/o lifts, heaves, or thrills. No carotid bruits. Capillary refill <2 seconds. Peripheral pulses 2+ bilaterally. GI: Abdomen soft w/o distention. Normoactive bowel sounds. No palpable masses or tenderness. No guarding or rebound tenderness. Liver and spleen w/o tenderness or enlargement. No CVA tenderness.  GU: Deferred.  MSK: Muscle tone and strength appropriate for age, w/o atrophy or abnormal movement.  EXTREMITIES: Active ROM intact, w/o tenderness, crepitus, or contracture. No obvious joint deformities or effusions. No clubbing, edema, or cyanosis.  NEUROLOGIC: CN's II-XII intact. Motor strength symmetrical with no obvious weakness. No sensory deficits. DTR's 2+ symmetric bilaterally. Steady, even gait.  PSYCH/MENTAL STATUS: Alert, oriented x 3. Cooperative, appropriate mood and affect.   Results for orders placed or performed during the hospital encounter of 12/22/21  POCT Urinalysis Dipstick (ED/UC)   Collection Time: 12/22/21  2:42 PM  Result Value Ref Range   Glucose, UA NEGATIVE NEGATIVE mg/dL   Bilirubin Urine NEGATIVE NEGATIVE    Ketones, ur NEGATIVE NEGATIVE mg/dL   Specific Gravity, Urine 1.025 1.005 - 1.030   Hgb urine dipstick NEGATIVE NEGATIVE   pH 7.0 5.0 - 8.0   Protein, ur NEGATIVE NEGATIVE mg/dL   Urobilinogen, UA 0.2 0.0 - 1.0 mg/dL   Nitrite NEGATIVE NEGATIVE   Leukocytes,Ua NEGATIVE NEGATIVE  Cervicovaginal ancillary only   Collection Time: 12/22/21  2:56 PM  Result Value Ref Range   Neisseria Gonorrhea Negative    Chlamydia Negative    Trichomonas Positive (A)    Bacterial Vaginitis (gardnerella) Positive (A)    Candida Vaginitis Negative    Candida Glabrata Negative    Comment      Normal Reference Range Bacterial Vaginosis - Negative   Comment Normal Reference Range Candida Species - Negative    Comment Normal Reference Range Candida Galbrata - Negative    Comment Normal Reference Range Trichomonas - Negative    Comment Normal Reference Ranger Chlamydia - Negative    Comment      Normal Reference Range Neisseria Gonorrhea - Negative    Assessment & Plan:   1. Wellness examination (Primary) - Encouraged a healthy well-balanced diet. Patient may adjust caloric intake to maintain or achieve ideal body weight. May reduce intake of dietary saturated fat and total fat and have adequate dietary potassium and calcium preferably from fresh fruits, vegetables, and low-fat dairy products.   - Advised to avoid cigarette smoking. - Discussed with the patient that most people either abstain from alcohol  or drink within safe limits (<=14/week and <=4 drinks/occasion for males, <=7/weeks and <= 3 drinks/occasion for females) and that the risk for alcohol  disorders and other health effects rises proportionally with the number of drinks per week and how often a drinker exceeds daily limits. - Discussed cessation/primary prevention of drug use and availability of treatment for abuse.  - Discussed sexually transmitted diseases, avoidance of unintended pregnancy and contraceptive alternatives. - Stressed the  importance of regular exercise - Injury prevention: Discussed safety belts, safety helmets, smoke detector, smoking near bedding or upholstery.  - Dental health: Discussed importance of regular tooth brushing, flossing, and dental visits.  - CBC with Differential/Platelet; Future - Comprehensive metabolic panel with GFR; Future - Hemoglobin A1c; Future - Lipid panel; Future - TSH Rfx on Abnormal to Free T4; Future  2. Acne vulgaris Most likely acne or eczema. Advised  patient to use gentle cleansers on face with adequate moisturizer. Did not tolerate doxycycline  due to GI side effects. Will switch to topical clindamycin . Referral placed to dermatology.  - Clindamycin  Phos, Once-Daily, (CLINDAGEL) 1 % GEL; Apply 1 Dose topically at bedtime.  Dispense: 75 mL; Refill: 3 - Ambulatory referral to Dermatology  3. Unilateral recurrent inguinal hernia without obstruction or gangrene Abdominal exam without tenderness to palpation and active bowel sounds. Inguinal hernia palpated midline inferior to navel with patient sitting up and increase in abdominal pressure. Patient would like to get this repaired. Referral placed for general surgery.  - Ambulatory referral to General Surgery   Return in about 1 year (around 01/01/2025) for Physical with fasting labs.  Patient to reach out to office if new, worrisome, or unresolved symptoms arise or if no improvement in patient's condition. Patient verbalized understanding and is agreeable to treatment plan. All questions answered to patient's satisfaction.   Wilhelmena Hanson, FNP

## 2024-01-02 NOTE — Patient Instructions (Addendum)
 Dental list         Updated 8.18.22 These dentists all accept Medicaid.  The list is a courtesy and for your convenience. Estos dentistas aceptan Medicaid.  La lista es para su [place] y es una cortesa.     [facility]     [phone] [address]  [address] [place] [address] Se habla espaol From 89 to 38 years old Parent may go with child only for cleaning Enedina Harrow DDS     [phone] Arlen Belton, DDS (Spanish speaking) [address] [place]  [address] Se habla espaol New patients 8 and under, established until 18y.o Parent may go with child if needed  Eudelia Hero DMD    [phone] [address] [place] [address] Se habla espaol [place] spoken From 40 years old Parent may go with child [facility]     [phone] [address] [place] [address] Se habla espaol, translation line, prefer for translator to be present  From 29 to 83 years old Ages 1-3y parents may go back 4+ go back by themselves parents can watch at "bay area"  [place] [facility]  [phone] [facility]      [address]  [place] [address] Se habla espaol [place] spoken (preferred to bring translator) From teeth coming in to 29 years old Parent may go with child  [facility]     [phone] [address] [place] [place] [address] Requires certification. Call for information. Requiere certificacin. Llame para informacin. Algunos dias se habla espaol  From birth to 20 years Parent possibly goes with child   Wadell Guild DDS     [phone] [id number]  [address] [place]  [address] Se habla espaol From 4 to 18 years  Parent may NOT go with child  [facility] DDS     Mallie Seal DDS  [phone] [address] [place] [address] Se habla espaol- phone interpreters Ages 10 years and older Parent may go with child- 15+ go back alone    Unknown Garbe DDS    [phone] [address] [place]  [address] Se habla espaol , 3 of their providers speak [place] From 18 months to 62 years old Parent may go with child [facility]  [phone] [address] [place] [address] Se habla espanol Interpretation for other languages Special needs children welcome Ages 46 and under  [facility]    [phone] [address] [place] [address] No se habla espaol From birth [facility]   [phone] Dr. Edwyna Grams [address] [place], [place] [address] From birth to 48 y- new patients 10 and under Special needs children welcome   [facility] [place] [phone] Se habla espaol [address] [place], [place] [address]  6 month to 19 years  [facility] L[place] San [phone] [address] [place], [place] [address]  Se habla espaol 6 months and up, highest age is 16-17 for new patients, will see established patients until 51 y.o Parents may go back with child      Health Maintenance Recommendations Screening Testing Mammogram Every 1 -2 years based on history and risk factors Starting at age 24 Pap Smear Ages 21-39 every 3 years Ages 82-65 every 5 years with HPV testing More frequent testing may be required based on results and history Colon Cancer Screening Every 1-10 years based on test performed,  risk factors, and history Starting at age 26 Bone Density Screening Every 2-10 years based on history Starting at age 58 for women Recommendations for men differ based on medication usage, history, and risk factors AAA Screening One time ultrasound Men 61-35 years old who have every smoked Lung Cancer Screening Low Dose Lung CT every 12 months Age 58-80 years with a 30 pack-year smoking history who still smoke or who have quit within the last 15 years   Screening Labs Routine  Labs: Complete Blood Count (CBC), Complete  Metabolic Panel (CMP), Cholesterol (Lipid Panel) Every 6-12 months based on history and medications May be recommended more frequently based on current conditions or previous results Hemoglobin A1c Lab Every 3-12 months based on history and previous results Starting at age 68 or earlier with diagnosis of diabetes, high cholesterol, BMI >26, and/or risk factors Frequent monitoring for patients with diabetes to ensure blood sugar control Thyroid Panel (TSH w/ T3 & T4) Every 6 months based on history, symptoms, and risk factors May be repeated more often if on medication HIV One time testing for all patients 5 and older May be repeated more frequently for patients with increased risk factors or exposure Hepatitis C One time testing for all patients 64 and older May be repeated more frequently for patients with increased risk factors or exposure Gonorrhea, Chlamydia Every 12 months for all sexually active persons 13-24 years Additional monitoring may be recommended for those who are considered high risk or who have symptoms PSA Men 15-8 years old with risk factors Additional screening may be recommended from age 46-69 based on risk factors, symptoms, and history   Vaccine Recommendations Tetanus Booster All adults every 10 years Flu Vaccine All patients 6 months and older every year COVID Vaccine All patients 12 years and older Initial dosing with booster May recommend additional booster based on age and health history HPV Vaccine 2 doses all patients age 53-26 Dosing may be considered for patients over 26 Shingles Vaccine (Shingrix) 2 doses all adults 55 years and older Pneumonia (Pneumovax 23) All adults 65 years and older May recommend earlier dosing based on health history Pneumonia (Prevnar 71) All adults 65 years and older Dosed 1 year after Pneumovax 23   Additional Screening, Testing, and Vaccinations may be recommended on an individualized basis based on family  history, health history, risk factors, and/or exposure.  __________________________________________________________   Diet Recommendations for All Patients   I recommend that all patients maintain a diet low in saturated fats, carbohydrates, and cholesterol. While this can be challenging at first, it is not impossible and small changes can make big differences.  Things to try: Decreasing the amount of soda, sweet tea, and/or juice to one or less per day and replace with water While water is always the first choice, if you do not like water you may consider adding a water additive without sugar to improve the taste other sugar free drinks Replace potatoes with a brightly colored vegetable at dinner Use healthy oils, such as canola oil or olive oil, instead of butter or hard margarine Limit your bread intake to two pieces or less a day Replace regular pasta with low carb pasta options Bake, broil, or grill foods instead of frying Monitor portion sizes  Eat smaller, more frequent meals throughout the day instead of large meals   An important thing to remember is, if you love foods that are not great for your health, you don't have to give them up completely.  Instead, allow these foods to be a reward when you have done well. Allowing yourself to still have special treats every once in a while is a nice way to tell yourself thank you for working hard to keep yourself healthy.    Also remember that every day is a new day. If you have a bad day and "fall off the wagon", you can still climb right back up and keep moving along on your journey!   We have resources available to help you!  Some websites that may be helpful include: www.http://www.wall-moore.info/        Www.VeryWellFit.com _____________________________________________________________   Activity Recommendations for All Patients   I recommend that all adults get at least 20 minutes of moderate physical activity that elevates your heart rate at  least 5 days out of the week.  Some examples include: Walking or jogging at a pace that allows you to carry on a conversation Cycling (stationary bike or outdoors) Water aerobics Yoga Weight lifting Dancing If physical limitations prevent you from putting stress on your joints, exercise in a pool or seated in a chair are excellent options.   Do determine your MAXIMUM heart rate for activity: YOUR AGE - 220 = MAX HeartRate    Remember! Do not push yourself too hard.  Start slowly and build up your pace, speed, weight, time in exercise, etc.  Allow your body to rest between exercise and get good sleep. You will need more water than normal when you are exerting yourself. Do not wait until you are thirsty to drink. Drink with a purpose of getting in at least 8, 8 ounce glasses of water a day plus more depending on how much you exercise and sweat.      If you begin to develop dizziness, chest pain, abdominal pain, jaw pain, shortness of breath, headache, vision changes, lightheadedness, or other concerning symptoms, stop the activity and allow your body to rest. If your symptoms are severe, seek emergency evaluation immediately. If your symptoms are concerning, but not severe, please let us  know so that we can recommend further evaluation.

## 2024-01-16 ENCOUNTER — Ambulatory Visit: Payer: MEDICAID | Admitting: General Surgery

## 2024-01-19 NOTE — Progress Notes (Deleted)
 GYNECOLOGY ANNUAL PHYSICAL EXAM NOTE  Subjective:    Natasha Riddle is a 38 y.o. [phone] female who presents for an annual exam.  The patient {is/is not/has never been:13135} sexually active. The patient participates in regular exercise: {yes/no/not asked:9010}. Has the patient ever been transfused or tattooed?: {yes/no/not asked:9010}. The patient reports that there {is/is not:9024} domestic violence in her life.   The patient has the following complaints today: Heavy periods  Menstrual History: Menarche age: *** Patient's last menstrual period was 01/02/2024 (exact date).     Gynecologic History:  Contraception: {method:5051} History of STI's:  Last Pap: ***. Results were: {norm/abn:16337}.  ***Denies/Notes h/o abnormal pap smears. Last mammogram: ***. 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   Pt declines use on 06/26/19  States last MJ age 89, last acid as a teen, denies meth use ever     Bipolar I disorder, most recent episode depressed (HCC) 01/07/2019   Depression    Heroin abuse (HCC) 11/24/2021   Iron deficiency anemia due to chronic blood loss 11/24/2021    Past Surgical History:  Procedure Laterality Date   TONSILLECTOMY     TUBAL LIGATION N/A 06/10/2017   Procedure: POST PARTUM TUBAL LIGATION;  Surgeon: Alben Alma, MD;  Location: [facility] ORS;  Service: Gynecology;  Laterality: N/A;    Family History  Problem Relation  Age of Onset   Bipolar disorder Mother    Ovarian cysts Mother    Asthma Father    Hypertension Maternal Grandfather    Diabetes Maternal Grandfather    Colon cancer Maternal Grandfather    Hypertension Paternal Grandfather    Heart attack Paternal Grandfather    Stroke Paternal Grandfather    Asthma Son    Hypothyroidism Maternal Aunt    Breast cancer Maternal Aunt     Social History   Socioeconomic History   Marital status: Significant Other    Spouse name: Not on file   Number of children: Not on file   Years of education: Not on file   Highest education level: Not on file  Occupational History   Not on file  Tobacco Use   Smoking status: Former    Current packs/day: 0.50    Average packs/day: 0.5 packs/day for 20.0 years (10.0 ttl pk-yrs)    Types: Cigarettes   Smokeless tobacco: Never  Vaping Use   Vaping status: Every Day  Substance and Sexual Activity   Alcohol  use: Not Currently   Drug use: Yes    Types: Methamphetamines   Sexual activity: Yes    Birth control/protection: Surgical    Comment: planning tubal ligation  Other Topics Concern   Not on file  Social History Narrative   Not on file   Social Drivers of Health   Financial Resource Strain: Not on file  Food Insecurity: Not on file  Transportation Needs: Not on file  Physical Activity: Not on file  Stress: Not on file  Social Connections: Not on file  Intimate Partner Violence: Not on file    Current Outpatient Medications on File Prior to Visit  Medication Sig Dispense Refill   Clindamycin  Phos, Once-Daily, (CLINDAGEL) 1 % GEL Apply 1 Dose topically at bedtime. 75 mL 3   doxycycline  (VIBRA -TABS) 100 MG tablet Take 1 tablet (100 mg total) by mouth 2 (two) times daily. 30 tablet 0   hydrOXYzine  (ATARAX ) 25 MG tablet Take 25 mg by mouth 3 (three) times daily.     sertraline (ZOLOFT) 100 MG tablet Take 100 mg by mouth daily.     sertraline (ZOLOFT) 50 MG tablet Take by mouth.     SUBOXONE 8-2  MG FILM Place under the tongue.     valACYclovir (VALTREX) 500 MG tablet Take 500 mg by mouth daily.     VYVANSE 40 MG capsule Take 40 mg by mouth daily.     No current facility-administered medications on file prior to visit.    No Known Allergies   Review of Systems Constitutional: negative for chills, fatigue, fevers and sweats Eyes: negative for irritation, redness and visual disturbance Ears, nose, mouth, throat, and face: negative for hearing loss, nasal congestion, snoring and tinnitus Respiratory: negative for asthma, cough, sputum Cardiovascular: negative for chest pain, dyspnea, exertional chest pressure/discomfort, irregular heart beat, palpitations and syncope Gastrointestinal: negative for abdominal pain, change in bowel habits, nausea and vomiting Genitourinary: negative for abnormal menstrual periods, genital lesions, sexual problems and vaginal discharge, dysuria and urinary incontinence Integument/breast: negative for breast lump, breast tenderness and nipple discharge Hematologic/lymphatic: negative for bleeding and easy bruising Musculoskeletal:negative for back pain and muscle weakness Neurological: negative for dizziness, headaches, vertigo and weakness Endocrine: negative for diabetic symptoms including polydipsia, polyuria and skin dryness Allergic/Immunologic: negative for hay fever and urticaria      Objective:  Last menstrual period 01/02/2024. There is no height or weight on file to calculate BMI.    General Appearance:    Alert, cooperative, no distress, appears stated age  Head:    Normocephalic, without obvious abnormality, atraumatic  Eyes:    PERRL, conjunctiva/corneas clear, EOMs intact bilaterally  Ears:    Normal external ear canals bilaterally  Nose:   Nares normal  Throat:   Lips, mucosa, and tongue normal; teeth and gums normal  Neck:   Supple, symmetrical, trachea midline, no adenopathy; thyroid: no enlargement/tenderness/nodules; no carotid  bruit or JVD  Back:     ROM normal, no CVA tenderness  Lungs:     Clear to auscultation bilaterally, respirations unlabored  Chest Wall:    No tenderness or deformity   Heart:    Regular rate and rhythm, S1 and S2 normal, no appreciated murmur, rub or gallop  Breast Exam:    No tenderness, masses, or nipple abnormality; no skin retraction, dimpling or nipple discharge.  Abdomen:     Soft, non-tender, bowel sounds active all four quadrants, no masses, no organomegaly.    Genitalia:    Pelvic:external genitalia normal, vagina without lesions, discharge, or tenderness, rectovaginal septum  normal. Cervix normal in appearance, no cervical motion tenderness, no adnexal masses or tenderness.  Uterus normal size, shape, mobile, regular contours,  nontender.  Rectal:    Normal external sphincter.  No hemorrhoids appreciated. Internal exam not done.   Extremities:   Extremities normal, atraumatic, no cyanosis or edema  Pulses:   2+ and symmetric all extremities  Skin:   Skin color, texture, turgor normal, no rashes or lesions  Lymph nodes:   Cervical, supraclavicular, and axillary nodes normal  Neurologic:   CNII-XII intact, normal strength, sensation and reflexes throughout   .  Labs:  Lab Results  Component Value Date   WBC 9.8 11/24/2021   HGB 10.8 (L) 11/24/2021   HCT 33.9 (L) 11/24/2021   MCV 74.2 (L) 11/24/2021   PLT 320 11/24/2021    Lab Results  Component Value Date   CREATININE 0.43 (L) 11/25/2021   BUN 8 11/25/2021   NA 136 11/25/2021   K 4.6 11/25/2021   CL 102 11/25/2021   CO2 28 11/25/2021    Lab Results  Component Value Date   ALT 664 (H) 11/25/2021   AST 566 (H) 11/25/2021   ALKPHOS 107 11/25/2021   BILITOT 0.4 11/25/2021    Lab Results  Component Value Date   TSH 0.946 01/06/2019     Assessment:   No diagnosis found.   Plan:   Natasha Riddle is a 38 y.o. [phone] female here today for her annual exam, doing well.  -Screenings:  Pap: done with cotesting  today  *** w/rflx today Mammogram: ordered***due *** Labs: ***A1C, CMP, HepC, Lipid panel, Vit D, TSH PHQ-2 = 0,    = ___, discussed coping techniques; RTC if worsens or develops concern -Contraception: *** -Vaccines: UTD, Tdap today; pt declines Influenza. Gardasil: series not started, declined today.  -Healthy lifestyle modifications discussed: multivitamin, diet, exercise, sunscreen. Emphasized importance of regular physical activity.  -Folic Acid recommendation reviewed.  -All questions answered to patient's satisfaction.  -RTC 1 yr for annual, sooner prn.    Rian Chafe, CMA [facility] at [facility]

## 2024-01-19 NOTE — Patient Instructions (Incomplete)

## 2024-01-26 ENCOUNTER — Encounter: Payer: MEDICAID | Admitting: Obstetrics

## 2024-01-26 DIAGNOSIS — Z124 Encounter for screening for malignant neoplasm of cervix: Secondary | ICD-10-CM

## 2024-01-26 DIAGNOSIS — Z01419 Encounter for gynecological examination (general) (routine) without abnormal findings: Secondary | ICD-10-CM

## 2024-01-29 ENCOUNTER — Encounter: Payer: Self-pay | Admitting: Obstetrics

## 2024-01-30 ENCOUNTER — Ambulatory Visit: Payer: MEDICAID | Admitting: General Surgery

## 2024-01-31 ENCOUNTER — Encounter: Payer: Self-pay | Admitting: Obstetrics

## 2024-02-20 ENCOUNTER — Ambulatory Visit: Payer: MEDICAID | Admitting: General Surgery

## 2024-03-05 ENCOUNTER — Ambulatory Visit: Payer: MEDICAID | Admitting: General Surgery

## 2024-03-05 ENCOUNTER — Encounter: Payer: Self-pay | Admitting: General Surgery

## 2024-03-05 VITALS — BP 134/87 | HR 81 | Temp 99.6°F | Ht 63.0 in | Wt 152.8 lb

## 2024-03-05 DIAGNOSIS — K429 Umbilical hernia without obstruction or gangrene: Secondary | ICD-10-CM | POA: Diagnosis not present

## 2024-03-05 DIAGNOSIS — M6208 Separation of muscle (nontraumatic), other site: Secondary | ICD-10-CM | POA: Diagnosis not present

## 2024-03-05 NOTE — Patient Instructions (Signed)
 We have scheduled you for a CT Scan of your Abdomen and Pelvis with contrast. This has been scheduled at [facility] on 03/08/2024 . Please arrive there by 3:15 pm . If you need to reschedule your Scan, you may do so by calling [phone]. Please let us  know if you reschedule your scan as we have to get authorization from your insurance for this.      You have requested for your Umbilical Hernia be repaired. This will be scheduled with Dr. Marinda at [facility].   If you are on any injectable weight loss medication, you will need to stop taking your GLP-1 injectable (weight loss) medications 8 days before your surgery to avoid any complications with anesthesia.   Please see your (blue)pre-care sheet for information. Our surgery scheduler will call you to verify surgery date and to go over information.   You will need to arrange to be off work for 1-2 weeks but will have to have a lifting restriction of no more than 15 lbs for 6 weeks following your surgery. If you have FMLA or disability paperwork that needs filled out you may drop this off at our office or this can be faxed to [phone].     Umbilical Hernia, Adult A hernia is a bulge of tissue that pushes through an opening between muscles. An umbilical hernia happens in the abdomen, near the belly button (umbilicus). The hernia may contain tissues from the small intestine, large intestine, or fatty tissue covering the intestines (omentum). Umbilical hernias in adults tend to get worse over time, and they require surgical treatment. There are several types of umbilical hernias. You may have: A hernia located just above or below the umbilicus (indirect hernia). This is the most common type of umbilical hernia in adults. A hernia that forms through an opening formed by the umbilicus (direct hernia). A hernia that comes and goes (reducible hernia). A reducible hernia may be visible only when you strain, lift  something heavy, or cough. This type of hernia can be pushed back into the abdomen (reduced). A hernia that traps abdominal tissue inside the hernia (incarcerated hernia). This type of hernia cannot be reduced. A hernia that cuts off blood flow to the tissues inside the hernia (strangulated hernia). The tissues can start to die if this happens. This type of hernia requires emergency treatment.  What are the causes? An umbilical hernia happens when tissue inside the abdomen presses on a weak area of the abdominal muscles. What increases the risk? You may have a greater risk of this condition if you: Are obese. Have had several pregnancies. Have a buildup of fluid inside your abdomen (ascites). Have had surgery that weakens the abdominal muscles.  What are the signs or symptoms? The main symptom of this condition is a painless bulge at or near the belly button. A reducible hernia may be visible only when you strain, lift something heavy, or cough. Other symptoms may include: Dull pain. A feeling of pressure.  Symptoms of a strangulated hernia may include: Pain that gets increasingly worse. Nausea and vomiting. Pain when pressing on the hernia. Skin over the hernia becoming red or purple. Constipation. Blood in the stool.  How is this diagnosed? This condition may be diagnosed based on: A physical exam. You may be asked to cough or strain while standing. These actions increase the pressure inside your abdomen and force the hernia through the opening in your muscles. Your health care provider  may try to reduce the hernia by pressing on it. Your symptoms and medical history.  How is this treated? Surgery is the only treatment for an umbilical hernia. Surgery for a strangulated hernia is done as soon as possible. If you have a small hernia that is not incarcerated, you may need to lose weight before having surgery. Follow these instructions at home: Lose weight, if told by your health  care provider. Do not try to push the hernia back in. Watch your hernia for any changes in color or size. Tell your health care provider if any changes occur. You may need to avoid activities that increase pressure on your hernia. Do not lift anything that is heavier than 10 lb (4.5 kg) until your health care provider says that this is safe. Take over-the-counter and prescription medicines only as told by your health care provider. Keep all follow-up visits as told by your health care provider. This is important. Contact a health care provider if: Your hernia gets larger. Your hernia becomes painful. Get help right away if: You develop sudden, severe pain near the area of your hernia. You have pain as well as nausea or vomiting. You have pain and the skin over your hernia changes color. You develop a fever. This information is not intended to replace advice given to you by your health care provider. Make sure you discuss any questions you have with your health care provider. Document Released: 01/08/2016 Document Revised: 04/10/2016 Document Reviewed: 01/08/2016 Elsevier Interactive Patient Education  [employer].

## 2024-03-05 NOTE — Progress Notes (Signed)
 Patient ID: Natasha Riddle, female   DOB: 10-19-1985, 38 y.o.   MRN: [id number] CC: Ventral Hernia History of Present Illness Natasha Riddle is a 38 y.o. female with past medical history of substance abuse disorder who is now 2 years sober, depression who presents in consultation for ventral hernia.  The patient reports that several years ago after she got her tubes tied she notes that there was a bulge at her incision site.  She says that at that time she was about 50 pounds heavier and noticed a large bulge that was reducible.  She reports that since she has lost weight she does not see a bulge.  She does not have any more pain with it but does get scared when she lifts heavy things or works out given that she did use to have pain when the hernia bulged out.  She denies any overlying skin changes.  She denies any obstipation.  She has never had any other abdominal surgeries except for a tubal ligation.  She currently has 3 kids and has no plans for future children.  She enjoys working out but her occupation does not include heavy lifting as she works at a [employer].  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   Pt declines use on 06/26/19  States last MJ age 27, last acid as a teen, denies meth use ever     Bipolar I disorder, most recent episode depressed (HCC) 01/07/2019   Depression    Heroin abuse (HCC) 11/24/2021   Iron deficiency anemia due to chronic blood loss 11/24/2021       Past Surgical History:  Procedure Laterality Date   TONSILLECTOMY     TUBAL LIGATION N/A 06/10/2017   Procedure: POST PARTUM TUBAL LIGATION;  Surgeon: Arloa Lamar SQUIBB, MD;  Location: [facility] ORS;  Service: Gynecology;  Laterality: N/A;    No Known Allergies  Current Outpatient Medications  Medication Sig Dispense Refill   Clindamycin  Phos, Once-Daily, (CLINDAGEL) 1 % GEL  Apply 1 Dose topically at bedtime. 75 mL 3   hydrOXYzine  (ATARAX ) 25 MG tablet Take 25 mg by mouth 3 (three) times daily.     sertraline (ZOLOFT) 100 MG tablet Take 100 mg by mouth daily.     sertraline (ZOLOFT) 50 MG tablet Take by mouth.     SUBOXONE 8-2 MG FILM Place under the tongue.     valACYclovir (VALTREX) 500 MG tablet Take 500 mg by mouth daily.     VYVANSE 40 MG capsule Take 40 mg by mouth daily.     No current facility-administered medications for this visit.    Family History Family History  Problem Relation Age of Onset   Bipolar disorder Mother    Ovarian cysts Mother    Asthma Father    Hypertension Maternal Grandfather    Diabetes Maternal Grandfather    Colon cancer Maternal Grandfather    Hypertension Paternal Grandfather    Heart attack Paternal Grandfather    Stroke Paternal Grandfather    Asthma Son    Hypothyroidism Maternal Aunt    Breast cancer Maternal Aunt        Social History Social History   Tobacco Use   Smoking status: Former    Current packs/day: 0.50    Average packs/day: 0.5 packs/day for 20.0 years (10.0 ttl pk-yrs)    Types: Cigarettes  Smokeless tobacco: Never  Vaping Use   Vaping status: Every Day  Substance Use Topics   Alcohol  use: Not Currently   Drug use: Yes    Types: Methamphetamines        ROS Full ROS of systems performed and is otherwise negative there than what is stated in the HPI  Physical Exam Blood pressure 134/87, pulse 81, temperature 99.6 F (37.6 C), temperature source Oral, height 5' 3 (1.6 m), weight 152 lb 12.8 oz (69.3 kg), SpO2 100%.  Alert and oriented x 3, normal work of breathing on room air, regular rate and rhythm, abdomen soft, there is a incision just below her umbilicus that is well-healed.  When she crunches there does seem to be a very small defect at the area of her surgical scar.  She also has a significant degree of diastases below the umbilicus. Data Reviewed  Outside records  reviewed from her primary care doctor who sent her here for a ventral hernia. I have personally reviewed the patient's imaging and medical records.    Assessment/Plan    38 year old female with likely ventral hernia secondary to a tubal ligation.  I discussed with her that she has both a small umbilical hernia that I can feel on exam as well as diastases recti.  I discussed with her that if she is having symptoms or is afraid to work out given this hernia that I would recommend it repaired.  However, given her excess skin from her weight loss that is difficult to see if there are any other hernia defects.  For that reason I would like to get a CT abdomen pelvis to help with operative planning.  I discussed with her that I would likely complete this robotically with mesh insertion but would decide on a final plan once the CT scan is done.  We will plan to see her back when the CT scan is done.  A total of 45 minutes was spent reviewing the patient's chart, performing an interval history and physical and discussing treatment options with the patient.   Jayson MALVA Endow 03/05/2024, 2:05 PM

## 2024-03-08 ENCOUNTER — Ambulatory Visit: Admission: RE | Admit: 2024-03-08 | Payer: MEDICAID | Source: Ambulatory Visit

## 2024-03-26 ENCOUNTER — Ambulatory Visit: Payer: MEDICAID | Admitting: General Surgery

## 2024-04-16 ENCOUNTER — Other Ambulatory Visit: Payer: Self-pay | Admitting: Family Medicine

## 2024-04-16 DIAGNOSIS — L700 Acne vulgaris: Secondary | ICD-10-CM

## 2024-05-14 ENCOUNTER — Ambulatory Visit: Payer: MEDICAID | Admitting: Family Medicine

## 2024-05-17 ENCOUNTER — Encounter: Payer: Self-pay | Admitting: Family Medicine

## 2024-05-17 ENCOUNTER — Other Ambulatory Visit: Payer: Self-pay

## 2024-05-17 ENCOUNTER — Ambulatory Visit: Payer: MEDICAID | Admitting: Family Medicine

## 2024-05-17 VITALS — BP 112/79 | HR 96 | Temp 98.1°F | Resp 2 | Ht 63.0 in | Wt 159.0 lb

## 2024-05-17 DIAGNOSIS — R232 Flushing: Secondary | ICD-10-CM | POA: Diagnosis not present

## 2024-05-17 DIAGNOSIS — R82998 Other abnormal findings in urine: Secondary | ICD-10-CM | POA: Diagnosis not present

## 2024-05-17 DIAGNOSIS — N939 Abnormal uterine and vaginal bleeding, unspecified: Secondary | ICD-10-CM

## 2024-05-17 DIAGNOSIS — N921 Excessive and frequent menstruation with irregular cycle: Secondary | ICD-10-CM

## 2024-05-17 DIAGNOSIS — R300 Dysuria: Secondary | ICD-10-CM

## 2024-05-17 DIAGNOSIS — N920 Excessive and frequent menstruation with regular cycle: Secondary | ICD-10-CM | POA: Insufficient documentation

## 2024-05-17 LAB — POCT URINALYSIS DIP (CLINITEK)
Bilirubin, UA: NEGATIVE
Blood, UA: NEGATIVE
Glucose, UA: NEGATIVE mg/dL
Ketones, POC UA: NEGATIVE mg/dL
Nitrite, UA: NEGATIVE — AB
Spec Grav, UA: 1.02 (ref 1.010–1.025)
Urobilinogen, UA: 1 U/dL
pH, UA: 8 (ref 5.0–8.0)

## 2024-05-17 NOTE — Progress Notes (Signed)
 Established Patient Office Visit  Subjective   Patient ID: Natasha Riddle, female    DOB: 18-May-1986  Age: 38 y.o. MRN: [id number]  Chief Complaint  Patient presents with   Urinary Tract Infection    HPI  Discussed the use of AI scribe software for clinical note transcription with the patient, who gave verbal consent to proceed.  History of Present Illness   Natasha Riddle is a 38 year old female who presents with urinary symptoms, hot flashes, and memory issues.  She has been experiencing urinary symptoms for about a week, including irritating pressure and foul-smelling, cloudy urine. There is no dysuria, but she feels a persistent urge to urinate without actual need. She cannot recall the last time she had a bladder infection, but believes it has been over a year.  For about a year, she has been experiencing severe hot flashes, describing episodes where she could be standing in front of the [facility] and still start sweating profusely. These episodes are accompanied by significant mood swings, including irritability and anger, which are difficult to control. She also reports a complete loss of libido despite being in a good place in her marriage.  She is concerned about her memory, noting increased forgetfulness affecting her job. This has been a gradual onset over the past year, with others also noticing her forgetfulness. She experiences immense exhaustion, feeling tired despite sleeping through the night.  Her menstrual cycles occur every four weeks and are described as extremely heavy, lasting about three days, with significant bleeding through a size five pad. She is unsure about her mother's menopausal history but mentions her mother had a hysterectomy.    Objective:     BP 112/79 (BP Location: Left Arm, Patient Position: Sitting, Cuff Size: Normal)   Pulse 96   Temp 98.1 F (36.7 C) (Oral)   Resp (!) 2   Ht 5' 3 (1.6 m)   Wt 159 lb (72.1 kg)   LMP 04/27/2024 (Approximate)   SpO2  99%   Breastfeeding No   BMI 28.17 kg/m    Physical Exam Vitals and nursing note reviewed.  Constitutional:      Appearance: Normal appearance.  HENT:     Head: Normocephalic and atraumatic.  Eyes:     Conjunctiva/sclera: Conjunctivae normal.  Cardiovascular:     Rate and Rhythm: Normal rate and regular rhythm.  Pulmonary:     Effort: Pulmonary effort is normal.     Breath sounds: Normal breath sounds.  Musculoskeletal:     Right lower leg: No edema.     Left lower leg: No edema.  Skin:    General: Skin is warm and dry.  Neurological:     Mental Status: She is alert and oriented to person, place, and time.  Psychiatric:        Mood and Affect: Mood normal.        Behavior: Behavior normal.        Thought Content: Thought content normal.        Judgment: Judgment normal.  Results for orders placed or performed in visit on 05/17/24  POCT URINALYSIS DIP (CLINITEK)  Result Value Ref Range   Color, UA brown (A) yellow   Clarity, UA cloudy (A) clear   Glucose, UA negative negative mg/dL   Bilirubin, UA negative negative   Ketones, POC UA negative negative mg/dL   Spec Grav, UA 8.979 8.989 - 1.025   Blood, UA negative negative   pH, UA 8.0 5.0 - 8.0   POC PROTEIN,UA trace negative, trace   Urobilinogen, UA 1.0 0.2 or 1.0 E.U./dL   Nitrite, UA Negative (A) Negative   Leukocytes, UA Trace (A) Negative      The ASCVD Risk score (Arnett DK, et al., 2019) failed to calculate for the following reasons:   The 2019 ASCVD risk score is only valid for ages 71 to 68    Assessment & Plan:  Dysuria Assessment & Plan: Urinalysis shows trace leukocyte esterase otherwise negative, specific gravity is 1.020.  It may be that her urine is concentrated and is irritating her bladder.  Ask her to push fluids that do not have caffeine.  Will send a culture for reassurance.  Orders: -      POCT URINALYSIS DIP (CLINITEK)  Leukocytes in urine -     Urine Culture  Hot flashes Assessment & Plan: From her description does sound like.  Peri menopausal flushing.  Will check estradiol progesterone FSH and LH.  Will also check thyroid function.  Orders: -     FSH/LH -     Estradiol -     Progesterone  Menorrhagia with irregular cycle Assessment & Plan: She is reporting very heavy periods.  Will check CBC and iron indices.  Orders: -     CBC with Differential/Platelet -     Iron, TIBC and Ferritin Panel  Abnormal uterine bleeding (AUB) -     TSH + free T4     Return if symptoms worsen or fail to improve.    Devika Dragovich K Brahm Barbeau, MD

## 2024-05-17 NOTE — Assessment & Plan Note (Signed)
 Urinalysis shows trace leukocyte esterase otherwise negative, specific gravity is 1.020.  It may be that her urine is concentrated and is irritating her bladder.  Ask her to push fluids that do not have caffeine.  Will send a culture for reassurance.

## 2024-05-17 NOTE — Assessment & Plan Note (Signed)
 From her description does sound like.  Peri menopausal flushing.  Will check estradiol progesterone FSH and LH.  Will also check thyroid function.

## 2024-05-17 NOTE — Assessment & Plan Note (Signed)
 She is reporting very heavy periods.  Will check CBC and iron indices.

## 2024-05-19 LAB — CBC WITH DIFFERENTIAL/PLATELET
Basophils Absolute: 0 x10E3/uL (ref 0.0–0.2)
Basos: 1 %
EOS (ABSOLUTE): 0.1 x10E3/uL (ref 0.0–0.4)
Eos: 1 %
Hematocrit: 31.7 % — ABNORMAL LOW (ref 34.0–46.6)
Hemoglobin: 9.6 g/dL — ABNORMAL LOW (ref 11.1–15.9)
Immature Grans (Abs): 0 x10E3/uL (ref 0.0–0.1)
Immature Granulocytes: 0 %
Lymphocytes Absolute: 2.6 x10E3/uL (ref 0.7–3.1)
Lymphs: 37 %
MCH: 24.5 pg — ABNORMAL LOW (ref 26.6–33.0)
MCHC: 30.3 g/dL — ABNORMAL LOW (ref 31.5–35.7)
MCV: 81 fL (ref 79–97)
Monocytes Absolute: 0.5 x10E3/uL (ref 0.1–0.9)
Monocytes: 7 %
Neutrophils Absolute: 3.8 x10E3/uL (ref 1.4–7.0)
Neutrophils: 54 %
Platelets: 258 x10E3/uL (ref 150–450)
RBC: 3.92 x10E6/uL (ref 3.77–5.28)
RDW: 14.5 % (ref 11.7–15.4)
WBC: 7 x10E3/uL (ref 3.4–10.8)

## 2024-05-19 LAB — IRON,TIBC AND FERRITIN PANEL
Ferritin: 9 ng/mL — ABNORMAL LOW (ref 15–150)
Iron Saturation: 7 % — CL (ref 15–55)
Iron: 26 ug/dL — ABNORMAL LOW (ref 27–159)
Total Iron Binding Capacity: 373 ug/dL (ref 250–450)
UIBC: 347 ug/dL (ref 131–425)

## 2024-05-19 LAB — PROGESTERONE: Progesterone: 1.7 ng/mL

## 2024-05-19 LAB — FSH/LH
FSH: 14 m[IU]/mL
LH: 91.5 m[IU]/mL

## 2024-05-19 LAB — TSH+FREE T4
Free T4: 0.97 ng/dL (ref 0.82–1.77)
TSH: 2.1 u[IU]/mL (ref 0.450–4.500)

## 2024-05-19 LAB — ESTRADIOL: Estradiol: 468 pg/mL

## 2024-05-20 ENCOUNTER — Ambulatory Visit: Payer: Self-pay | Admitting: Family Medicine

## 2024-05-21 ENCOUNTER — Other Ambulatory Visit: Payer: Self-pay | Admitting: Family Medicine

## 2024-05-21 DIAGNOSIS — N3090 Cystitis, unspecified without hematuria: Secondary | ICD-10-CM

## 2024-05-21 LAB — URINE CULTURE

## 2024-05-21 MED ORDER — SULFAMETHOXAZOLE-TRIMETHOPRIM 800-160 MG PO TABS: 1.0000 | ORAL_TABLET | Freq: Two times a day (BID) | ORAL | 0 refills | Status: AC

## 2024-06-25 ENCOUNTER — Ambulatory Visit: Payer: MEDICAID | Admitting: Family Medicine

## 2024-07-04 ENCOUNTER — Ambulatory Visit: Payer: MEDICAID | Admitting: Family Medicine

## 2024-07-12 ENCOUNTER — Ambulatory Visit: Payer: MEDICAID | Admitting: Family Medicine

## 2024-07-16 ENCOUNTER — Ambulatory Visit: Payer: MEDICAID | Admitting: Family Medicine

## 2024-07-23 ENCOUNTER — Ambulatory Visit: Payer: MEDICAID | Admitting: Family Medicine

## 2024-08-27 ENCOUNTER — Encounter: Payer: Self-pay | Admitting: Family Medicine

## 2024-08-27 ENCOUNTER — Ambulatory Visit: Payer: MEDICAID | Admitting: Family Medicine
